# Patient Record
Sex: Female | Born: 1975 | Race: Black or African American | Hispanic: No | Marital: Single | Smoking: Current every day smoker
Health system: Southern US, Community
[De-identification: ages and names within clinical notes are randomized; demographics above are authoritative.]

## PROBLEM LIST (undated history)

## (undated) DIAGNOSIS — R55 Syncope and collapse: Secondary | ICD-10-CM

## (undated) DIAGNOSIS — D649 Anemia, unspecified: Secondary | ICD-10-CM

## (undated) DIAGNOSIS — R011 Cardiac murmur, unspecified: Secondary | ICD-10-CM

## (undated) DIAGNOSIS — R51 Headache: Secondary | ICD-10-CM

## (undated) DIAGNOSIS — I10 Essential (primary) hypertension: Secondary | ICD-10-CM

## (undated) DIAGNOSIS — I639 Cerebral infarction, unspecified: Secondary | ICD-10-CM

## (undated) DIAGNOSIS — I82409 Acute embolism and thrombosis of unspecified deep veins of unspecified lower extremity: Secondary | ICD-10-CM

## (undated) DIAGNOSIS — N920 Excessive and frequent menstruation with regular cycle: Secondary | ICD-10-CM

## (undated) DIAGNOSIS — I2699 Other pulmonary embolism without acute cor pulmonale: Secondary | ICD-10-CM

## (undated) DIAGNOSIS — R519 Headache, unspecified: Secondary | ICD-10-CM

## (undated) DIAGNOSIS — E669 Obesity, unspecified: Secondary | ICD-10-CM

## (undated) HISTORY — DX: Obesity, unspecified: E66.9

## (undated) HISTORY — DX: Essential (primary) hypertension: I10

## (undated) HISTORY — DX: Syncope and collapse: R55

## (undated) HISTORY — DX: Anemia, unspecified: D64.9

## (undated) HISTORY — DX: Excessive and frequent menstruation with regular cycle: N92.0

---
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
## 1998-04-10 ENCOUNTER — Inpatient Hospital Stay (HOSPITAL_COMMUNITY): Admission: AD | Admit: 1998-04-10 | Discharge: 1998-04-10 | Payer: Self-pay | Admitting: Obstetrics

## 1998-05-12 ENCOUNTER — Other Ambulatory Visit: Admission: RE | Admit: 1998-05-12 | Discharge: 1998-05-12 | Payer: Self-pay | Admitting: Obstetrics

## 1998-05-12 ENCOUNTER — Ambulatory Visit (HOSPITAL_COMMUNITY): Admission: RE | Admit: 1998-05-12 | Discharge: 1998-05-12 | Payer: Self-pay | Admitting: Obstetrics

## 1998-05-27 ENCOUNTER — Inpatient Hospital Stay (HOSPITAL_COMMUNITY): Admission: AD | Admit: 1998-05-27 | Discharge: 1998-05-27 | Payer: Self-pay | Admitting: Obstetrics

## 1998-06-03 ENCOUNTER — Ambulatory Visit (HOSPITAL_COMMUNITY): Admission: RE | Admit: 1998-06-03 | Discharge: 1998-06-03 | Payer: Self-pay | Admitting: Obstetrics

## 1998-07-30 ENCOUNTER — Ambulatory Visit (HOSPITAL_COMMUNITY): Admission: RE | Admit: 1998-07-30 | Discharge: 1998-07-30 | Payer: Self-pay | Admitting: Obstetrics

## 1998-11-06 ENCOUNTER — Inpatient Hospital Stay (HOSPITAL_COMMUNITY): Admission: AD | Admit: 1998-11-06 | Discharge: 1998-11-06 | Payer: Self-pay | Admitting: Obstetrics

## 1998-11-22 ENCOUNTER — Inpatient Hospital Stay (HOSPITAL_COMMUNITY): Admission: AD | Admit: 1998-11-22 | Discharge: 1998-11-25 | Payer: Self-pay | Admitting: Obstetrics

## 1998-11-27 ENCOUNTER — Encounter (HOSPITAL_COMMUNITY): Admission: RE | Admit: 1998-11-27 | Discharge: 1999-02-25 | Payer: Self-pay | Admitting: Obstetrics

## 1999-01-26 ENCOUNTER — Encounter (HOSPITAL_COMMUNITY): Admission: RE | Admit: 1999-01-26 | Discharge: 1999-04-26 | Payer: Self-pay | Admitting: *Deleted

## 1999-05-02 ENCOUNTER — Encounter (HOSPITAL_COMMUNITY): Admission: RE | Admit: 1999-05-02 | Discharge: 1999-07-31 | Payer: Self-pay | Admitting: Obstetrics

## 1999-09-29 ENCOUNTER — Encounter (HOSPITAL_COMMUNITY): Admission: RE | Admit: 1999-09-29 | Discharge: 1999-12-28 | Payer: Self-pay | Admitting: Obstetrics

## 1999-11-08 ENCOUNTER — Other Ambulatory Visit: Admission: RE | Admit: 1999-11-08 | Discharge: 1999-11-08 | Payer: Self-pay | Admitting: Obstetrics

## 1999-12-30 ENCOUNTER — Encounter: Admission: RE | Admit: 1999-12-30 | Discharge: 2000-03-29 | Payer: Self-pay | Admitting: Obstetrics

## 2000-02-06 ENCOUNTER — Other Ambulatory Visit: Admission: RE | Admit: 2000-02-06 | Discharge: 2000-02-06 | Payer: Self-pay | Admitting: Obstetrics

## 2000-04-09 ENCOUNTER — Inpatient Hospital Stay (HOSPITAL_COMMUNITY): Admission: AD | Admit: 2000-04-09 | Discharge: 2000-04-09 | Payer: Self-pay | Admitting: Obstetrics

## 2000-04-23 ENCOUNTER — Inpatient Hospital Stay (HOSPITAL_COMMUNITY): Admission: AD | Admit: 2000-04-23 | Discharge: 2000-04-23 | Payer: Self-pay | Admitting: Obstetrics

## 2000-04-28 ENCOUNTER — Encounter: Admission: RE | Admit: 2000-04-28 | Discharge: 2000-07-27 | Payer: Self-pay | Admitting: Obstetrics

## 2000-04-28 ENCOUNTER — Inpatient Hospital Stay (HOSPITAL_COMMUNITY): Admission: AD | Admit: 2000-04-28 | Discharge: 2000-04-28 | Payer: Self-pay | Admitting: Obstetrics

## 2000-05-02 ENCOUNTER — Inpatient Hospital Stay (HOSPITAL_COMMUNITY): Admission: AD | Admit: 2000-05-02 | Discharge: 2000-05-02 | Payer: Self-pay | Admitting: Obstetrics

## 2000-05-02 ENCOUNTER — Inpatient Hospital Stay (HOSPITAL_COMMUNITY): Admission: AD | Admit: 2000-05-02 | Discharge: 2000-05-05 | Payer: Self-pay | Admitting: Obstetrics

## 2000-07-29 ENCOUNTER — Encounter: Admission: RE | Admit: 2000-07-29 | Discharge: 2000-08-09 | Payer: Self-pay | Admitting: Obstetrics

## 2001-07-20 ENCOUNTER — Emergency Department (HOSPITAL_COMMUNITY): Admission: EM | Admit: 2001-07-20 | Discharge: 2001-07-20 | Payer: Self-pay | Admitting: Emergency Medicine

## 2001-07-25 ENCOUNTER — Emergency Department (HOSPITAL_COMMUNITY): Admission: EM | Admit: 2001-07-25 | Discharge: 2001-07-25 | Payer: Self-pay | Admitting: Emergency Medicine

## 2003-11-30 ENCOUNTER — Inpatient Hospital Stay (HOSPITAL_COMMUNITY): Admission: AD | Admit: 2003-11-30 | Discharge: 2003-12-02 | Payer: Self-pay | Admitting: Obstetrics

## 2004-03-02 ENCOUNTER — Ambulatory Visit (HOSPITAL_COMMUNITY): Admission: RE | Admit: 2004-03-02 | Discharge: 2004-03-02 | Payer: Self-pay | Admitting: Obstetrics

## 2005-09-15 ENCOUNTER — Emergency Department (HOSPITAL_COMMUNITY): Admission: EM | Admit: 2005-09-15 | Discharge: 2005-09-15 | Payer: Self-pay | Admitting: Emergency Medicine

## 2005-09-17 ENCOUNTER — Emergency Department (HOSPITAL_COMMUNITY): Admission: EM | Admit: 2005-09-17 | Discharge: 2005-09-17 | Payer: Self-pay | Admitting: Emergency Medicine

## 2005-11-20 HISTORY — PX: TUBAL LIGATION: SHX77

## 2005-12-21 ENCOUNTER — Encounter (INDEPENDENT_AMBULATORY_CARE_PROVIDER_SITE_OTHER): Payer: Self-pay | Admitting: *Deleted

## 2005-12-21 LAB — CONVERTED CEMR LAB

## 2005-12-23 ENCOUNTER — Inpatient Hospital Stay (HOSPITAL_COMMUNITY): Admission: AD | Admit: 2005-12-23 | Discharge: 2005-12-23 | Payer: Self-pay | Admitting: Obstetrics & Gynecology

## 2006-01-03 ENCOUNTER — Ambulatory Visit: Payer: Self-pay | Admitting: Family Medicine

## 2006-01-08 ENCOUNTER — Ambulatory Visit: Payer: Self-pay | Admitting: Family Medicine

## 2006-02-27 ENCOUNTER — Ambulatory Visit: Payer: Self-pay | Admitting: Family Medicine

## 2006-03-01 ENCOUNTER — Ambulatory Visit (HOSPITAL_COMMUNITY): Admission: RE | Admit: 2006-03-01 | Discharge: 2006-03-01 | Payer: Self-pay | Admitting: Family Medicine

## 2006-03-06 ENCOUNTER — Ambulatory Visit: Payer: Self-pay | Admitting: Family Medicine

## 2006-05-25 ENCOUNTER — Ambulatory Visit: Payer: Self-pay | Admitting: Family Medicine

## 2006-06-22 ENCOUNTER — Ambulatory Visit: Payer: Self-pay | Admitting: Sports Medicine

## 2006-06-27 ENCOUNTER — Ambulatory Visit (HOSPITAL_COMMUNITY): Admission: RE | Admit: 2006-06-27 | Discharge: 2006-06-27 | Payer: Self-pay | Admitting: Internal Medicine

## 2006-07-11 ENCOUNTER — Ambulatory Visit: Payer: Self-pay | Admitting: Sports Medicine

## 2006-07-26 ENCOUNTER — Inpatient Hospital Stay (HOSPITAL_COMMUNITY): Admission: AD | Admit: 2006-07-26 | Discharge: 2006-07-29 | Payer: Self-pay | Admitting: Obstetrics and Gynecology

## 2006-07-26 ENCOUNTER — Ambulatory Visit: Payer: Self-pay | Admitting: Gynecology

## 2006-07-26 ENCOUNTER — Ambulatory Visit: Payer: Self-pay | Admitting: Family Medicine

## 2006-08-06 ENCOUNTER — Ambulatory Visit: Payer: Self-pay | Admitting: Family Medicine

## 2006-09-11 ENCOUNTER — Ambulatory Visit: Payer: Self-pay | Admitting: Family Medicine

## 2006-09-20 ENCOUNTER — Ambulatory Visit: Payer: Self-pay | Admitting: Family Medicine

## 2007-01-17 DIAGNOSIS — I10 Essential (primary) hypertension: Secondary | ICD-10-CM

## 2007-01-17 DIAGNOSIS — D509 Iron deficiency anemia, unspecified: Secondary | ICD-10-CM | POA: Insufficient documentation

## 2007-01-18 ENCOUNTER — Encounter (INDEPENDENT_AMBULATORY_CARE_PROVIDER_SITE_OTHER): Payer: Self-pay | Admitting: *Deleted

## 2009-06-25 ENCOUNTER — Ambulatory Visit: Payer: Self-pay | Admitting: Family Medicine

## 2009-06-25 LAB — CONVERTED CEMR LAB: Beta hcg, urine, semiquantitative: NEGATIVE

## 2009-08-28 ENCOUNTER — Encounter (INDEPENDENT_AMBULATORY_CARE_PROVIDER_SITE_OTHER): Payer: Self-pay | Admitting: *Deleted

## 2009-08-28 DIAGNOSIS — F17200 Nicotine dependence, unspecified, uncomplicated: Secondary | ICD-10-CM

## 2010-08-26 ENCOUNTER — Encounter: Payer: Self-pay | Admitting: Family Medicine

## 2010-12-20 NOTE — Letter (Signed)
Summary: Generic Letter  [facility]     [place], [place]    Phone:   Fax:     08/26/2010  [facility] [address] [place], [place]  [address]  Dear Ms. Raia,    I am writing to inform you that you need to make an appointment to come in for your yearly well-woman visit, which includes a PAP smear. Please call our office to schedule an appointment with me, Dr. Fara Boros, your new primary care doctor.  I look forward to meeting you!       Sincerely,   Demetria Pore MD  Appended Document: Generic Letter mailed.  Appended Document: Generic Letter Letter returned unable to forward.

## 2011-04-07 NOTE — Op Note (Signed)
Melissa Krueger, Melissa Krueger                     ACCOUNT NO.:  [id number]   MEDICAL RECORD NO.:  [id number]                   PATIENT TYPE:  AMB   LOCATION:  SDC                                  [facility]ILITY:  [facility]   PHYSICIAN:  Kathreen Cosier, M.D.           DATE OF BIRTH:  1976/06/29   DATE OF PROCEDURE:  03/02/2004  DATE OF DISCHARGE:                                 OPERATIVE REPORT   PREOPERATIVE DIAGNOSIS:  Multiparity.   POSTOPERATIVE DIAGNOSIS:  Multiparity.   PROCEDURE:  Open laparoscopic tubal sterilization.   Under general anesthesia, the patient in lithotomy position, the abdomen,  perineum, and vagina were prepped and draped.  Bladder emptied with straight  catheter, with the speculum placed in the vagina and the Hulka tenaculum  placed in the cervix.  In the umbilicus, a transversae incision was made and  carried down to the fascia.  The fascia was grasped with two Kochers,  cleaned, and the fascia and the peritoneum opened with the Mayo scissors  The sleeve of the trocar was inserted intraperitoneally.  3 liters of carbon  dioxide infused intraperitoneally.  Visualizing the scope was inserted.  The  uterus, tubes, and ovaries were normal.  The cautery probe inserted through  the sleeve of the scope.  The right tube was grasped 1 inch from the cornua,  cauterized.  The tube was cauterized in a total of four places moving  lateral from the first site of cautery.  Approximately 2 inches of tube  cauterized.  The probe was removed, CO2 allowed to escape from the  peritoneal cavity, and the fascia was closed with one stitch of 0 Dexon.  The skin was closed with subcuticular stitch of 4-0 Monocryl.  The patient  tolerated the procedure well and was taken to the recovery room in good  condition.                                               Kathreen Cosier, M.D.    BAM/MEDQ  D:  03/02/2004  T:  03/02/2004  Job:  [id number]

## 2011-04-07 NOTE — Op Note (Signed)
NAMELIBRADA, CASTRONOVO         ACCOUNT NO.:  [id number]   MEDICAL RECORD NO.:  [id number]          PATIENT TYPE:  INP   LOCATION:  9373                          [facility]ACILITY:  [facility]   PHYSICIAN:  Lesly Dukes, M.D. DATE OF BIRTH:  1976/11/19   DATE OF PROCEDURE:  07/28/2006  DATE OF DISCHARGE:                                 OPERATIVE REPORT   PREOPERATIVE DIAGNOSIS:  The patient is a 35 year old para 5 female who  desires sterilization.   POSTOPERATIVE DIAGNOSIS:  The patient is a 35 year old para 56 female who  desires sterilization.   PROCEDURE:  Repeat postpartum bilateral tubal ligation.   SURGEON:  Lesly Dukes, M.D.   ANESTHESIA:  Spinal.   ESTIMATED BLOOD LOSS:  Minimal.   SPECIMENS:  None.   COMPLICATIONS:  None.   DESCRIPTION OF PROCEDURE:  After informed consent was obtained, the patient  was taken to the operating room and spinal anesthesia was induced.  The  patient was placed in the dorsal lithotomy position and the patient was  prepped and draped in the normal sterile fashion.  The bladder was emptied.  An umbilical skin incision was made with the scalpel and carried down to the  fascia.  The fascia was incised vertically and the peritoneum was entered  bluntly and traction was placed on the abdomen; and a survey of abdominal  contents was performed.   It was noted that the patient had had a tubal ligation before and this was  done via cautery, via laparoscopy.  The fallopian tubes were not connected  to the cornua any more.  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  There were only small pieces of tube hanging with lots of adhesions.  I am  unsure exactly how the patient got pregnant.  I did place 2 __________ clips  across the left fallopian tube and 1 __________ across the right fallopian  tube.  However, given each fallopian tube was transected, I am not sure how  the patient is getting pregnant.  I am going to recommend that the patient  use an IUD for contraception.  I cannot guarantee that my procedure is going  to work either.  There was good hemostasis at the end of the procedure.  All  instruments and laps were removed form the patient's abdomen and the counts  were correct.  The fascia was closed with #0 Vicryl and the skin was closed  with 4-0 Vicryl in a subcuticular fashion.   The patient tolerated the procedure well.  All counts were correct and the  patient went to the recovery room in stable condition.  Her blood pressure  160/100 postpartum.  She is chronically hypertensive; however, she was  deemed __________ and I will continue this postoperatively.           ______________________________  Lesly Dukes, M.D.     KHL/MEDQ  D:  07/28/2006  T:  07/28/2006  Job:  [id number]

## 2011-04-07 NOTE — Discharge Summary (Signed)
NAMETEMPRENCE, RHINES         ACCOUNT NO.:  [id number]   MEDICAL RECORD NO.:  [id number]          PATIENT TYPE:  INP   LOCATION:  9373                          [facility]ILITY:  [facility]   PHYSICIAN:  Lesly Dukes, M.D. DATE OF BIRTH:  1975-11-21   DATE OF ADMISSION:  07/26/2006  DATE OF DISCHARGE:  07/29/2006                                 DISCHARGE SUMMARY   HISTORY OF PRESENT ILLNESS:  The patient is a 35 year old G5, P5-0-0-5, who  was admitted on July 26, 2006 for induction of labor at 39 weeks for  chronic hypertension. The patient was a patient of Dr. __________ at the  [facility]. She was induced with Pitocin, which was  unsuccessful. She then had Cytotec placed and then Pitocin restarted, which  was then successful. She delivered a viable female infant with Apgar's of 9 at  1 minute and 9 at 5 minutes who weighed 7 pounds, 11 ounces, and was 20 3/4  inches long. The baby was delivered vertex presentation. The placenta  delivered spontaneously and had a 3 vessel cord. There were no lacerations  or episiotomy. The EBL was approximately 500 cc. The amniotic fluid was  clear throughout the procedure. The patient received penicillin for GBS  unknown, based on risk factors. The patient underwent tubal ligation  postpartum day 1. She had a previous tubal ligation by Dr. Gaynell Face. At the  time of the tubal, it was noted that both tubes were completely transected.  __________ clips were applied again. Details of this operation are in the  operative note. It is suggested that the patient use another form of backup  birth control. It is unsure whether this tubal will work as well. She was  __________ postpartum because she had elevated blood pressures postpartum.  Blood pressures on discharge are 130's to 140's over 80's. She is a known  chronic hypertensive but has not been on medications.   DISCHARGE MEDICATIONS:  Percocet, iron, and Colace.   LABORATORY DATA:  On  discharge, hemoglobin 7.9.   ACTIVITY:  Nothing per vagina for 6 weeks. No vigorous exercise for 6 weeks.   FOLLOWUP:  1. Dr. __________ for 1 week, blood pressure check.  2. Postpartum examination in 6 weeks for IUD insertion.           ______________________________  Lesly Dukes, M.D.     KHL/MEDQ  D:  07/29/2006  T:  07/29/2006  Job:  [id number]

## 2011-10-05 ENCOUNTER — Ambulatory Visit (INDEPENDENT_AMBULATORY_CARE_PROVIDER_SITE_OTHER): Payer: Medicaid Other | Admitting: Family Medicine

## 2011-10-05 ENCOUNTER — Encounter: Payer: Self-pay | Admitting: Family Medicine

## 2011-10-05 VITALS — BP 154/102 | HR 80 | Temp 98.2°F | Ht 67.5 in | Wt 232.0 lb

## 2011-10-05 DIAGNOSIS — Z0000 Encounter for general adult medical examination without abnormal findings: Secondary | ICD-10-CM

## 2011-10-05 DIAGNOSIS — F17200 Nicotine dependence, unspecified, uncomplicated: Secondary | ICD-10-CM

## 2011-10-05 DIAGNOSIS — E669 Obesity, unspecified: Secondary | ICD-10-CM

## 2011-10-05 DIAGNOSIS — Z01419 Encounter for gynecological examination (general) (routine) without abnormal findings: Secondary | ICD-10-CM | POA: Insufficient documentation

## 2011-10-05 DIAGNOSIS — I10 Essential (primary) hypertension: Secondary | ICD-10-CM

## 2011-10-05 DIAGNOSIS — Z23 Encounter for immunization: Secondary | ICD-10-CM

## 2011-10-05 LAB — CBC
HCT: 34.4 % — ABNORMAL LOW (ref 36.0–46.0)
Hemoglobin: 11.2 g/dL — ABNORMAL LOW (ref 12.0–15.0)
MCH: 22.9 pg — ABNORMAL LOW (ref 26.0–34.0)
MCHC: 32.6 g/dL (ref 30.0–36.0)
Platelets: 364 10*3/uL (ref 150–400)
RBC: 4.9 MIL/uL (ref 3.87–5.11)
RDW: 18.1 % — ABNORMAL HIGH (ref 11.5–15.5)
WBC: 9.5 10*3/uL (ref 4.0–10.5)

## 2011-10-05 LAB — LIPID PANEL
Cholesterol: 190 mg/dL (ref 0–200)
HDL: 36 mg/dL — ABNORMAL LOW (ref >39)
Total CHOL/HDL Ratio: 5.3 Ratio
Triglycerides: 97 mg/dL (ref <150)
VLDL: 19 mg/dL (ref 0–40)

## 2011-10-05 LAB — BASIC METABOLIC PANEL
BUN: 6 mg/dL (ref 6–23)
Glucose, Bld: 82 mg/dL (ref 70–99)
Potassium: 4 mEq/L (ref 3.5–5.3)
Sodium: 139 mEq/L (ref 135–145)

## 2011-10-05 MED ORDER — LISINOPRIL-HYDROCHLOROTHIAZIDE 10-12.5 MG PO TABS: 1.0000 | ORAL_TABLET | Freq: Every day | ORAL | Status: DC

## 2011-10-05 MED ORDER — NICOTINE 7 MG/24HR TD PT24: 1.0000 | MEDICATED_PATCH | TRANSDERMAL | Status: AC

## 2011-10-05 MED ORDER — NICOTINE POLACRILEX 2 MG MT GUM: 2.0000 mg | CHEWING_GUM | OROMUCOSAL | Status: AC | PRN

## 2011-10-05 NOTE — Assessment & Plan Note (Signed)
Blood pressure substantially elevated today. Will check bmet and start patient on lisinopril/HCTZ. Counseled patient on a low sodium diet.  BP Readings from Last 3 Encounters:  10/05/11 154/102  06/25/09 134/86

## 2011-10-05 NOTE — Assessment & Plan Note (Signed)
Patient reports being interested in quitting. Sent in prescriptions for nicotine patch and nicotine gum. Patient also given information on the quit line. Consider sending to pharm clinic if further support needed.

## 2011-10-05 NOTE — Progress Notes (Signed)
S: Pt comes in today for general exam.  Patient went to the dentist have some dental work done and was told that she needed to see her regular doctor because her blood pressure was too high. So the patient made an appointment to be seen. Patient would prefer to wait to have a Pap smear done as she started her menstrual cycle yesterday.  HYPERTENSION BP: 164/121 --> 154/102 Meds: NONE Symptoms: Headache: No Dizziness: No Vision changes: No SOB:  No Chest pain: No LE swelling: Yes : At the end of the day Tobacco use: Yes Diet: Eats one meal per day which is dinner, sometimes tries to make a salad but does admit that the salad has large amounts of meat, cheese, and egg in it. She sometimes fries and sometimes bakes food. She uses a lot of hamburger. She knows that she does not eat enough fruits and veggies. She does endorse drinking 5 cans of regular Mountain Dew soda per. According to pt, she had high blood pressure during her last pregnancy and was started on medication. This medication was stopped not long after she delivered. She has not been on any antihypertensive medication since then.  SYNCOPE One episode a few weeks ago. Patient states she was standing at home and that ended in a hectic day. She thinks that it may been related to her blood pressure getting high. She did not have any presyncopal symptoms prior to the episode of passing out. She denied any chest pain, shortness of breath, dizziness, sweating, or nausea. She states that she was "a little woozy" for about 5 minutes afterwards. Did not have any urine or bowel incontinence. Her children did not report any shaking or seizure like activity during the syncopal episode. This has never happened before and she has not had an episode since the one episode a few weeks ago.  OBESITY Eats one meal per day which is dinner, sometimes tries to make a salad but does admit that the salad has large amounts of meat, cheese, and egg in it. She sometimes  fries and sometimes bakes food. She uses a lot of hamburger. She knows that she does not eat enough fruits and veggies. She does endorse drinking 5 cans of regular Mountain Dew soda per. Patient does walk her children to school every day. She reports that this is approximately a 10-15 minute walk. There is along the route that she could walk.  TOBACCO USE Patient used to smoke a half pack per day approximately 3 months ago. Since then, she has decreased to 5 cigarettes per day. She is very interested in quitting smoking. She has not tried anything to help her quit. She is just trying to cut down. She is interested in assistance.   ROS: Per HPI  History  Smoking status  . Current Everyday Smoker -- 0.2 packs/day  . Types: Cigarettes  Smokeless tobacco  . Never Used    O:  Filed Vitals:   10/05/11 1001  BP: 154/102  Pulse: 80  Temp: 98.2 F (36.8 C)    Gen: NAD HEENT: poor dentition, pharynx nonerythematous, very poor dentition without obvious abscess CV: RRR, no murmur Pulm: CTA bilat, no wheezes or crackles Abd: soft, NT, obese, + BS Ext: Warm, no chronic skin changes, no edema   A/P: 35 y.o. female p/w HTN -See problem list -f/u in 3-4 weeks

## 2011-10-05 NOTE — Assessment & Plan Note (Signed)
Patient initially scheduled as a physical but would like to wait for her Pap smear since she is on her menstrual cycle. Since patient is to followup in one month for blood pressure I told her we could do it at that time. Patient denies flu or tetanus shot today. Patient is fasting so we'll check baseline labs including BMET, lipid panel, CBC.

## 2011-10-05 NOTE — Assessment & Plan Note (Signed)
Body mass index is 35.80 kg/(m^2). Patient is obese. Counseled patient on eating 3 meals per day. Patient agrees to start with decreasing her soda intake from 5 cans to 3 cans per day. She will also take the "long route "when she is walking her children to school so that she gets 30 minutes of walking 5 times per week. Informed patient I will followup at next visit. That this will be a slow process but that weight loss is very important for her health.

## 2011-10-05 NOTE — Patient Instructions (Addendum)
It was great to meet you today! Your blood pressure was very high!  We really need to work on getting this down! I'm going to start a medicine to help with this.  However, the MOST important thing is going to be lifestyle changes!!!!  This means eating better, exercising more, and trying to lose weight.  Watching the SALT in your diet is also going to be very import!  Today, you agreed to: cutting back on soda.  You will drink no more than 3 per day (down from 5) You will also take the "long" way walking the kids to and from the bus stop.  This means that you will be walking 30 minutes 5 days per week!  It's great that you are already thinking about quitting smoking!  This is very important for your health.  There are many things we can do to help you quit.  You can also call 1-800-QUIT-NOW (539) 570-3915) for free smoking cessation counseling.  I am also sending in a prescription for a nicotine patch.  This will help take the edge off of the nicotine.  You can also use the gum to help with any cravings.   We checked some blood work today.  You can expect a letter from me in the next few weeks with the results.  Come back and see me in 3-4 weeks so we can recheck your blood pressure and see how quitting smoking is going!     1.5 Gram Low Sodium Diet A 1.5 gram sodium diet restricts the amount of salt in your diet. You can have no more than 1.5 grams (1500 miligrams) in 1 day. This can help lessen your risk for developing high blood pressure. This diet may also reduce your chance of having a heart attack or stroke. It is important that you know what to look for when choosing foods and drinks.  HOME CARE   Do not add salt to food.   Avoid convenience items and fast food.   Choose unsalted snack foods.   Buy products labeled "low sodium" or "no salt added" when possible.   Check food labels to learn how much sodium is in 1 serving.   When eating at a restaurant, ask that your food be  prepared with less salt or none, if possible.  The nutrition facts label is a good place to find how much sodium is in foods. Look for products with no more than 400 mg of sodium per serving. Remember that 1.5 g = 1500 mg. CHOOSING FOODS Grains  Avoid: Salted crackers and snack items. Some cereals, including instant hot cereals. Bread stuffing and biscuit mixes. Seasoned rice or pasta mixes.   Choose: Unsalted snack items. Low-sodium cereals, oats, puffed wheat and rice, shredded wheat. English muffins and bread. Pasta.  Meats  Avoid:  Salted, canned, smoked, spiced, pickled meats, including fish and poultry. Bacon, ham, sausage, cold cuts, hot dogs, anchovies.   Choose: Low-sodium canned tuna and salmon. Fresh or frozen meat, poultry, and fish.  Dairy  Avoid: Processed cheese and spreads. Cottage cheese. Buttermilk and condensed milk. Regular cheese.   Choose:  Milk. Low-sodium cottage cheese. Yogurt. Sour cream. Low-sodium cheese.  Fruits and Vegetables  Avoid:  Regular canned vegetables. Regular canned tomato sauce and paste. Frozen vegetables in sauces. Olives. Rosita Fire. Relishes. Sauerkraut.   Choose:  Low-sodium canned vegetables. Low-sodium tomato sauce and paste. Frozen or fresh vegetables. Fresh and frozen fruit.  Condiments  Avoid:  Canned and packaged gravies. Worcestershire  sauce. Tartar sauce. Barbecue sauce. Soy sauce. Steak sauce. Ketchup. Onion, garlic, and table salt. Meat flavorings and tenderizers.   Choose:  Fresh and dried herbs and spices. Low-sodium varieties of mustard and ketchup. Lemon juice. Tabasco sauce. Horseradish.  Document Released: 12/09/2010 Document Revised: 07/19/2011 Document Reviewed: 12/09/2010 [facility] Patient Information 2012 [place], [place].

## 2011-11-01 ENCOUNTER — Ambulatory Visit: Payer: Medicaid Other | Admitting: Family Medicine

## 2011-11-27 ENCOUNTER — Ambulatory Visit: Payer: Medicaid Other | Admitting: Family Medicine

## 2012-01-24 ENCOUNTER — Encounter (HOSPITAL_COMMUNITY): Payer: Self-pay | Admitting: *Deleted

## 2012-01-24 ENCOUNTER — Emergency Department (HOSPITAL_COMMUNITY)
Admission: EM | Admit: 2012-01-24 | Discharge: 2012-01-24 | Disposition: A | Discharge status: Home / Self Care | Payer: Self-pay | Attending: Emergency Medicine | Admitting: Emergency Medicine

## 2012-01-24 DIAGNOSIS — F17200 Nicotine dependence, unspecified, uncomplicated: Secondary | ICD-10-CM | POA: Insufficient documentation

## 2012-01-24 DIAGNOSIS — K047 Periapical abscess without sinus: Secondary | ICD-10-CM | POA: Insufficient documentation

## 2012-01-24 DIAGNOSIS — I10 Essential (primary) hypertension: Secondary | ICD-10-CM | POA: Insufficient documentation

## 2012-01-24 MED ORDER — PENICILLIN V POTASSIUM 500 MG PO TABS: 500.0000 mg | ORAL_TABLET | Freq: Four times a day (QID) | ORAL | Status: AC

## 2012-01-24 MED ORDER — PENICILLIN V POTASSIUM 250 MG PO TABS
500.0000 mg | ORAL_TABLET | Freq: Once | ORAL | Status: AC
  Administered 2012-01-24: 500 mg via ORAL
  Filled 2012-01-24: qty 2

## 2012-01-24 MED ORDER — OXYCODONE-ACETAMINOPHEN 5-325 MG PO TABS: 1.0000 | ORAL_TABLET | ORAL | Status: AC | PRN

## 2012-01-24 MED ORDER — OXYCODONE-ACETAMINOPHEN 5-325 MG PO TABS
2.0000 | ORAL_TABLET | Freq: Once | ORAL | Status: AC
  Administered 2012-01-24: 2 via ORAL
  Filled 2012-01-24: qty 2

## 2012-01-24 NOTE — Discharge Instructions (Signed)
Abscessed Tooth A tooth abscess is a collection of infected fluid (pus) from a bacterial infection in the inner part of the tooth (pulp). It usually occurs at the end of the tooth's root.  CAUSES   A very bad cavity (extensive tooth decay).   Trauma to the tooth, such as a broken or chipped tooth, that allows bacteria to enter into the pulp.  SYMPTOMS  Severe pain in and around the infected tooth.   Swelling and redness around the abscessed tooth or in the mouth or face.   Tenderness.   Pus drainage.   Bad breath.   Bitter taste in the mouth.   Difficulty swallowing.   Difficulty opening the mouth.   Feeling sick to your stomach (nauseous).   Vomiting.   Chills.   Swollen neck glands.  DIAGNOSIS  A medical and dental history will be taken.   An examination will be performed by tapping on the abscessed tooth.   X-rays may be taken of the tooth to identify the abscess.  TREATMENT The goal of treatment is to eliminate the infection.   You may be prescribed antibiotic medicine to stop the infection from spreading.   A root canal may be performed to save the tooth. If the tooth cannot be saved, it may be pulled (extracted) and the abscess may be drained.  HOME CARE INSTRUCTIONS  Only take over-the-counter or prescription medicines for pain, fever, or discomfort as directed by your caregiver.   Do not drive after taking pain medicine (narcotics).   Rinse your mouth (gargle) often with salt water ( tsp salt in 8 oz of warm water) to relieve pain or swelling.   Do not apply heat to the outside of your face.   Return to your dentist for further treatment as directed.  SEEK IMMEDIATE DENTAL CARE IF:  You have a temperature by mouth above 102 F (38.9 C), not controlled by medicine.   You have chills or a very bad headache.   You have problems breathing or swallowing.   Your have trouble opening your mouth.   You develop swelling in the neck or around the eye.    Your pain is not helped by medicine.   Your pain is getting worse instead of better.  Document Released: 11/06/2005 Document Revised: 10/26/2011 Document Reviewed: 02/14/2011 [employer] Patient Information 2012 [employer]. 

## 2012-01-24 NOTE — ED Provider Notes (Signed)
History     CSN: [id number]  Arrival date & time 01/24/12  1478   First MD Initiated Contact with Patient 01/24/12 1105      Chief Complaint  Patient presents with  . Dental Pain     Patient is a 36 y.o. female presenting with tooth pain. The history is provided by the patient.  Dental PainThe primary symptoms include mouth pain and fever. Primary symptoms do not include shortness of breath, angioedema or cough. The symptoms began 3 to 5 days ago. The symptoms are worsening. The symptoms are new. The symptoms occur constantly.  Additional symptoms include: facial swelling. Additional symptoms do not include: drooling.  she has done nothing for the symptoms  She reports dental pain and left facial swelling No trauma to face Reports pain was "so bad I passed out" yesterday.  No head injury, no traumatic injury reported.  No cp/sob/dizziness She reports fever/chills No cp/sob No neck stiffness  Past Medical History  Diagnosis Date  . Hypertension   . Anemia     Past Surgical History  Procedure Date  . Tubal ligation   . Breast surgery     Family History  Problem Relation Age of Onset  . Hypertension Mother   . Hyperlipidemia Mother   . Diabetes Father   . Alcohol abuse Father   . Lupus Sister     History  Substance Use Topics  . Smoking status: Current Everyday Smoker -- 0.2 packs/day    Types: Cigarettes  . Smokeless tobacco: Never Used  . Alcohol Use: Yes    OB History    Grav Para Term Preterm Abortions TAB SAB Ect Mult Living                  Review of Systems  Constitutional: Positive for fever.  HENT: Positive for facial swelling. Negative for drooling.   Respiratory: Negative for cough and shortness of breath.     Allergies  Review of patient's allergies indicates no known allergies.  Home Medications   Current Outpatient Rx  Name Route Sig Dispense Refill  . ACETAMINOPHEN 500 MG PO TABS Oral Take 500 mg by mouth every 6 (six) hours as  needed. For pain    . OXYCODONE-ACETAMINOPHEN 5-325 MG PO TABS Oral Take 1 tablet by mouth every 4 (four) hours as needed for pain. 15 tablet 0  . PENICILLIN V POTASSIUM 500 MG PO TABS Oral Take 1 tablet (500 mg total) by mouth 4 (four) times daily. 40 tablet 0    BP 149/115  Pulse 100  Temp(Src) 98.8 F (37.1 C) (Oral)  Resp 16  SpO2 95%  LMP 01/17/2012  Physical Exam CONSTITUTIONAL: Well developed/well nourished HEAD AND FACE: Normocephalic/atraumatic EYES: EOMI/PERRL ENMT: Mucous membranes moist, no trismus.  Tenderness along gingival surface of left mandible but no visible intraoral abscess.  There is focal tenderness/swelling externally under mandible, but no surrounding erythema/abscess.  Voice normal.  No drooling.  Poor dentition.  No facial deformity/crepitance noted Spine- cspine nontender NECK: supple no meningeal signs CV: S1/S2 noted, no murmurs/rubs/gallops noted LUNGS: Lungs are clear to auscultation bilaterally, no apparent distress ABDOMEN: soft, nontender, no rebound or guarding GU:no cva tenderness NEURO: Pt is awake/alert, moves all extremitiesx4 EXTREMITIES: pulses normal, full ROM, no tenderness noted, no deformity noted SKIN: warm, color normal PSYCH: no abnormalities of mood noted  ED Course  Procedures    1. Dental abscess    Pt will need close f/u with dental, advised calling them today,  start PCN, discussed strict return precautions.  The patient appears reasonably screened and/or stabilized for discharge and I doubt any other medical condition or other [facility] requiring further screening, evaluation, or treatment in the ED at this time prior to discharge.    MDM  Nursing notes reviewed and considered in documentation         Joya Gaskins, MD 01/24/12 1230

## 2012-01-24 NOTE — ED Notes (Signed)
Pt is here with swelling to her left lower jaw which began Sunday.  Pt has severe dental pain with this.  Pt has fractured molar and generally poor dentition.  Pt has had fever and chills with this as well as a syncopal episode yesterday while getting her kids from school.  No sob with this.  Swelling along left jaw line and under chin.

## 2012-12-21 ENCOUNTER — Emergency Department (HOSPITAL_COMMUNITY): Payer: Medicaid Other

## 2012-12-21 ENCOUNTER — Encounter (HOSPITAL_COMMUNITY): Payer: Self-pay | Admitting: Emergency Medicine

## 2012-12-21 ENCOUNTER — Emergency Department (HOSPITAL_COMMUNITY)
Admission: EM | Admit: 2012-12-21 | Discharge: 2012-12-21 | Disposition: A | Discharge status: Home / Self Care | Payer: Medicaid Other | Attending: Emergency Medicine | Admitting: Emergency Medicine

## 2012-12-21 DIAGNOSIS — Z79899 Other long term (current) drug therapy: Secondary | ICD-10-CM | POA: Insufficient documentation

## 2012-12-21 DIAGNOSIS — I10 Essential (primary) hypertension: Secondary | ICD-10-CM | POA: Insufficient documentation

## 2012-12-21 DIAGNOSIS — M545 Low back pain, unspecified: Secondary | ICD-10-CM | POA: Insufficient documentation

## 2012-12-21 DIAGNOSIS — Z862 Personal history of diseases of the blood and blood-forming organs and certain disorders involving the immune mechanism: Secondary | ICD-10-CM | POA: Insufficient documentation

## 2012-12-21 DIAGNOSIS — S20229A Contusion of unspecified back wall of thorax, initial encounter: Secondary | ICD-10-CM | POA: Insufficient documentation

## 2012-12-21 DIAGNOSIS — F17200 Nicotine dependence, unspecified, uncomplicated: Secondary | ICD-10-CM | POA: Insufficient documentation

## 2012-12-21 MED ORDER — DIAZEPAM 5 MG PO TABS
5.0000 mg | ORAL_TABLET | Freq: Once | ORAL | Status: AC
  Administered 2012-12-21: 5 mg via ORAL
  Filled 2012-12-21: qty 1

## 2012-12-21 MED ORDER — HYDROCODONE-ACETAMINOPHEN 5-325 MG PO TABS
2.0000 | ORAL_TABLET | Freq: Once | ORAL | Status: AC
  Administered 2012-12-21: 2 via ORAL
  Filled 2012-12-21: qty 2

## 2012-12-21 MED ORDER — HYDROCODONE-ACETAMINOPHEN 5-325 MG PO TABS: 1.0000 | ORAL_TABLET | Freq: Four times a day (QID) | ORAL | Status: DC | PRN

## 2012-12-21 MED ORDER — NAPROXEN 500 MG PO TABS: 500.0000 mg | ORAL_TABLET | Freq: Two times a day (BID) | ORAL | Status: DC | PRN

## 2012-12-21 MED ORDER — METHOCARBAMOL 750 MG PO TABS: 750.0000 mg | ORAL_TABLET | Freq: Four times a day (QID) | ORAL | Status: DC | PRN
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
## 2012-12-22 NOTE — ED Provider Notes (Signed)
Medical screening examination/treatment/procedure(s) were performed by non-physician practitioner and as supervising physician I was immediately available for consultation/collaboration.  Mujahid Jalomo M Raahi Korber, MD 12/22/12 1217 

## 2014-05-25 ENCOUNTER — Encounter: Payer: Self-pay | Admitting: Family Medicine

## 2014-05-25 ENCOUNTER — Ambulatory Visit (HOSPITAL_COMMUNITY)
Admission: RE | Admit: 2014-05-25 | Discharge: 2014-05-25 | Disposition: A | Discharge status: Home / Self Care | Payer: Medicaid Other | Source: Ambulatory Visit | Attending: Family Medicine | Admitting: Family Medicine

## 2014-05-25 ENCOUNTER — Ambulatory Visit (INDEPENDENT_AMBULATORY_CARE_PROVIDER_SITE_OTHER): Payer: Medicaid Other | Admitting: Family Medicine

## 2014-05-25 VITALS — BP 164/103 | HR 115 | Temp 98.3°F | Wt 214.5 lb

## 2014-05-25 DIAGNOSIS — R55 Syncope and collapse: Secondary | ICD-10-CM | POA: Insufficient documentation

## 2014-05-25 DIAGNOSIS — D509 Iron deficiency anemia, unspecified: Secondary | ICD-10-CM

## 2014-05-25 DIAGNOSIS — I10 Essential (primary) hypertension: Secondary | ICD-10-CM | POA: Diagnosis not present

## 2014-05-25 MED ORDER — LISINOPRIL-HYDROCHLOROTHIAZIDE 20-25 MG PO TABS: 1.0000 | ORAL_TABLET | Freq: Every day | ORAL | Status: DC

## 2014-05-25 NOTE — Assessment & Plan Note (Addendum)
Syncope x3 episodes in the past, most recent about one month ago No clear etiology from her history, her uncontrolled hypertension is concerning EKG today with prolonged QTC and no other obvious abnormalities. Will refer to cardiology for further workup including possible Holter monitor and stress test, which I discussed with the patient
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
## 2014-05-25 NOTE — Patient Instructions (Signed)
Great to meet you!  I will call or send a letter about your labs  I have made a referaal to cardiology, they'll call for an appt  Come back in 2-4 weeks for a pap smear and HTN follow up  Hypertension Hypertension is another name for high blood pressure. High blood pressure forces your heart to work harder to pump blood. A blood pressure reading has two numbers, which includes a higher number over a lower number (example: 110/72). HOME CARE   Have your blood pressure rechecked by your doctor.  Only take medicine as told by your doctor. Follow the directions carefully. The medicine does not work as well if you skip doses. Skipping doses also puts you at risk for problems.  Do not smoke.  Monitor your blood pressure at home as told by your doctor. GET HELP IF:  You think you are having a reaction to the medicine you are taking.  You have repeat headaches or feel dizzy.  You have puffiness (swelling) in your ankles.  You have trouble with your vision. GET HELP RIGHT AWAY IF:   You get a very bad headache and are confused.  You feel weak, numb, or faint.  You get chest or belly (abdominal) pain.  You throw up (vomit).  You cannot breathe very well. MAKE SURE YOU:   Understand these instructions.  Will watch your condition.  Will get help right away if you are not doing well or get worse. Document Released: 04/24/2008 Document Revised: 11/11/2013 Document Reviewed: 08/29/2013 [facility] Patient Information 2015 [place], [place]. This information is not intended to replace advice given to you by your health care provider. Make sure you discuss any questions you have with your health care provider.

## 2014-05-25 NOTE — Assessment & Plan Note (Signed)
Uncontrolled and untreated per patient She has been started on lisinopril/HCTZ before Discussed dangers including MI and stroke CMP, CBC, TSH today Start lisinopril/HCTZ Followup 2-4 weeks

## 2014-05-25 NOTE — Assessment & Plan Note (Signed)
Likely iron deficiency anemia because of heavy menses Check CBC today Discuss further at next visit.

## 2014-05-26 LAB — CBC WITH DIFFERENTIAL/PLATELET
Basophils Absolute: 0 10*3/uL (ref 0.0–0.1)
Basophils Relative: 0 % (ref 0–1)
Eosinophils Absolute: 0.2 10*3/uL (ref 0.0–0.7)
Eosinophils Relative: 2 % (ref 0–5)
HCT: 33.2 % — ABNORMAL LOW (ref 36.0–46.0)
Hemoglobin: 10.3 g/dL — ABNORMAL LOW (ref 12.0–15.0)
Lymphocytes Relative: 36 % (ref 12–46)
Lymphs Abs: 3.3 10*3/uL (ref 0.7–4.0)
MCH: 21.3 pg — ABNORMAL LOW (ref 26.0–34.0)
MCHC: 31 g/dL (ref 30.0–36.0)
MCV: 68.7 fL — ABNORMAL LOW (ref 78.0–100.0)
Monocytes Absolute: 0.5 10*3/uL (ref 0.1–1.0)
Monocytes Relative: 6 % (ref 3–12)
Neutro Abs: 5.1 10*3/uL (ref 1.7–7.7)
Neutrophils Relative %: 56 % (ref 43–77)
Platelets: 314 10*3/uL (ref 150–400)
RBC: 4.83 MIL/uL (ref 3.87–5.11)
RDW: 20.3 % — ABNORMAL HIGH (ref 11.5–15.5)
WBC: 9.1 10*3/uL (ref 4.0–10.5)

## 2014-05-26 LAB — COMPREHENSIVE METABOLIC PANEL
ALT: <8 U/L (ref 0–35)
AST: 13 U/L (ref 0–37)
Albumin: 3.7 g/dL (ref 3.5–5.2)
Alkaline Phosphatase: 62 U/L (ref 39–117)
BUN: 5 mg/dL — ABNORMAL LOW (ref 6–23)
CO2: 24 mEq/L (ref 19–32)
Calcium: 8.1 mg/dL — ABNORMAL LOW (ref 8.4–10.5)
Chloride: 107 mEq/L (ref 96–112)
Creat: 0.65 mg/dL (ref 0.50–1.10)
Glucose, Bld: 75 mg/dL (ref 70–99)
Potassium: 3.7 mEq/L (ref 3.5–5.3)
Sodium: 138 mEq/L (ref 135–145)
Total Bilirubin: 0.3 mg/dL (ref 0.2–1.2)
Total Protein: 6.4 g/dL (ref 6.0–8.3)

## 2014-05-26 LAB — TSH: TSH: 0.58 u[IU]/mL (ref 0.350–4.500)

## 2014-05-28 ENCOUNTER — Telehealth: Payer: Self-pay | Admitting: Family Medicine

## 2014-05-28 NOTE — Telephone Encounter (Signed)
LVM to Pt   appt 08/19@10 :45 [facility]  [address]    [place]

## 2014-05-29 ENCOUNTER — Telehealth: Payer: Self-pay | Admitting: Family Medicine

## 2014-05-29 ENCOUNTER — Encounter: Payer: Self-pay | Admitting: Family Medicine

## 2014-05-29 MED ORDER — FERROUS SULFATE 325 (65 FE) MG PO TABS: 325.0000 mg | ORAL_TABLET | Freq: Every day | ORAL | Status: DC

## 2014-05-29 NOTE — Telephone Encounter (Signed)
Informed patient of message form MD, patient expressed understanding. 

## 2014-05-29 NOTE — Telephone Encounter (Signed)
Microcytic anemia, likely iron defficicnecy 2/2 menorrhagia. Will send Rx for iron and ask staff to explain, f/u 2 weeks as scheduled.    Murtis SinkSam Chiyoko Torrico, MD [facility] Resident, PGY-3 05/29/2014, 1:52 PM

## 2014-06-16 ENCOUNTER — Telehealth: Payer: Self-pay | Admitting: *Deleted

## 2014-06-16 ENCOUNTER — Ambulatory Visit (INDEPENDENT_AMBULATORY_CARE_PROVIDER_SITE_OTHER): Payer: Medicaid Other | Admitting: Family Medicine

## 2014-06-16 ENCOUNTER — Encounter: Payer: Self-pay | Admitting: Family Medicine

## 2014-06-16 ENCOUNTER — Other Ambulatory Visit (HOSPITAL_COMMUNITY)
Admission: RE | Admit: 2014-06-16 | Discharge: 2014-06-16 | Disposition: A | Discharge status: Home / Self Care | Payer: Medicaid Other | Source: Ambulatory Visit | Attending: Family Medicine | Admitting: Family Medicine

## 2014-06-16 VITALS — BP 136/93 | HR 73 | Temp 98.2°F | Ht 67.0 in | Wt 216.2 lb

## 2014-06-16 DIAGNOSIS — N920 Excessive and frequent menstruation with regular cycle: Secondary | ICD-10-CM | POA: Insufficient documentation

## 2014-06-16 DIAGNOSIS — Z23 Encounter for immunization: Secondary | ICD-10-CM

## 2014-06-16 DIAGNOSIS — Z0000 Encounter for general adult medical examination without abnormal findings: Secondary | ICD-10-CM | POA: Insufficient documentation

## 2014-06-16 DIAGNOSIS — Z01419 Encounter for gynecological examination (general) (routine) without abnormal findings: Secondary | ICD-10-CM | POA: Diagnosis not present

## 2014-06-16 DIAGNOSIS — D509 Iron deficiency anemia, unspecified: Secondary | ICD-10-CM

## 2014-06-16 DIAGNOSIS — F17200 Nicotine dependence, unspecified, uncomplicated: Secondary | ICD-10-CM

## 2014-06-16 DIAGNOSIS — Z1151 Encounter for screening for human papillomavirus (HPV): Secondary | ICD-10-CM | POA: Diagnosis present

## 2014-06-16 DIAGNOSIS — I10 Essential (primary) hypertension: Secondary | ICD-10-CM

## 2014-06-16 LAB — FERRITIN: Ferritin: 5 ng/mL — ABNORMAL LOW (ref 10–291)

## 2014-06-16 NOTE — Telephone Encounter (Signed)
LMOVM to call us back. I [facility] for [facility] @ 9:15 am. She has to have a full bladder, so drink water an hour before. Cerise Lieber CMA

## 2014-06-16 NOTE — Assessment & Plan Note (Addendum)
Pap  Today No concern for STI, s/p tubal

## 2014-06-16 NOTE — Assessment & Plan Note (Signed)
Improved Slight side effects of meds, likely due to relative hypotension Continue HCTZ and ACEi Follow up  3 months

## 2014-06-16 NOTE — Assessment & Plan Note (Signed)
Pap and TDap today

## 2014-06-16 NOTE — Progress Notes (Signed)
Patient ID: Melissa LenzJosephine Hesler, female   DOB: 08/12/1976, 38 y.o.   MRN: [id number]  Kevin FentonSamuel Baltazar Pekala, MD Phone: [phone]  Subjective:  Chief complaint-noted  Pt Here for followup hypertension and Pap smear  Hypertension Has been taking lisinopril and HCTZ every day Not checking blood pressure at home No headache, chest pain, dyspnea, leg edema, palpitations since our last talk She has had slight dizziness and fatigue during the day she carries to the medicine, if she takes the medicine at night to get away from the dizziness during the day she has a hard time sleeping due to 2 polyuria  Well woman exam, periods Has had 2 partners in the last 6 months, uses condoms inconsistently Periods last 6 days and she uses 7 pads per day. Happen every 4 weeks She describes her flow as heavy, Depo has not helped her much in the past 2 to exacerbations of what I believe she is describing is hidradenitis suppurativa She is a smoker and smokes about 6 cigarettes a day  Smoking has smoked for 15 years, has cut back quite a bit and would like to quit.  ROS-  Per HPI  Past Medical History Patient Active Problem List   Diagnosis Date Noted  . Menorrhagia 06/16/2014  . Healthcare maintenance 06/16/2014  . Syncope 05/25/2014  . Well woman exam 10/05/2011  . Obesity 10/05/2011  . TOBACCO USER 08/28/2009  . ANEMIA, IRON DEFICIENCY, UNSPEC. 01/17/2007  . HYPERTENSION, BENIGN SYSTEMIC 01/17/2007    Medications- reviewed and updated Current Outpatient Prescriptions  Medication Sig Dispense Refill  . ferrous sulfate 325 (65 FE) MG tablet Take 1 tablet (325 mg total) by mouth daily with breakfast.  30 tablet  3  . lisinopril-hydrochlorothiazide (PRINZIDE,ZESTORETIC) 20-25 MG per tablet Take 1 tablet by mouth daily.  30 tablet  2  . acetaminophen (TYLENOL) 500 MG tablet Take 500 mg by mouth every 6 (six) hours as needed. For pain      . HYDROcodone-acetaminophen (NORCO/VICODIN) 5-325 MG per  tablet Take 1 tablet by mouth every 6 (six) hours as needed for pain (Take 1 - 2 tablets every 4 - 6 hours.).  20 tablet  0  . methocarbamol (ROBAXIN) 750 MG tablet Take 1 tablet (750 mg total) by mouth 4 (four) times daily as needed (Take 1 tablet every 6 hours as needed for muscle spasms.).  20 tablet  0  . naproxen (NAPROSYN) 500 MG tablet Take 1 tablet (500 mg total) by mouth 2 (two) times daily as needed.  30 tablet  0   No current facility-administered medications for this visit.    Objective: BP 136/93  Pulse 73  Temp(Src) 98.2 F (36.8 C) (Oral)  Ht 5\' 7"  (1.702 m)  Wt 216 lb 3.2 oz (98.068 kg)  BMI 33.85 kg/m2  LMP 05/31/2014 Gen: NAD, alert, cooperative with exam HEENT: NCAT CV: RRR, good S1/S2, no murmur Resp: CTABL, no wheezes, non-labored Ext: No edema, warm Neuro: Alert and oriented, No gross deficits GU: Cervix normal appearing, vaginal walls well rugated and pink, minimal cervical discharge, no adnexal tenderness or fullness, area of firmness just posterior to the cervix that's nontender to palpation   Assessment/Plan:  HYPERTENSION, BENIGN SYSTEMIC Improved Slight side effects of meds, likely due to relative hypotension Continue HCTZ and ACEi Follow up  3 months  ANEMIA, IRON DEFICIENCY, UNSPEC. Microcytic anemia, likely iron defficiency from [employer] Start Iron Discussed options for controling flow, OCPs not an option due to smoking Discussed IUD, Depo - pt to  consider Pap today   Well woman exam Pap  Today No concern for STI, s/p tubal  Menorrhagia Microcytic anemia, periods appear to be on the heavy side but not grossly heavy Patient feels they're abnormal TSH WNL, no signs of insulin resistance Bimanual exam with firmness just posterior to the cervix but no adnexal fullness, suspicion for fibroid evaluate with transvaginal ultrasound, check ferritin  TOBACCO USER Wants to quit Recommended 1800 quit now, shed like to wait on other  pharmacologic support  Healthcare maintenance Pap and TDap today

## 2014-06-16 NOTE — Assessment & Plan Note (Addendum)
Microcytic anemia, periods appear to be on the heavy side but not grossly heavy Patient feels they're abnormal TSH WNL, no signs of insulin resistance Bimanual exam with firmness just posterior to the cervix but no adnexal fullness, suspicion for fibroid evaluate with transvaginal ultrasound, check ferritin

## 2014-06-16 NOTE — Patient Instructions (Signed)
Great to see you again!  I will check a lab and set up an US for your bleeding  There are options, we'll talk after the results  Palpitations A palpitation is the feeling that your heartbeat is irregular or is faster than normal. It may feel like your heart is fluttering or skipping a beat. Palpitations are usually not a serious problem. However, in some cases, you may need further medical evaluation. CAUSES  Palpitations can be caused by:  Smoking.  Caffeine or other stimulants, such as diet pills or energy drinks.  Alcohol.  Stress and anxiety.  Strenuous physical activity.  Fatigue.  Certain medicines.  Heart disease, especially if you have a history of irregular heart rhythms (arrhythmias), such as atrial fibrillation, atrial flutter, or supraventricular tachycardia.  An improperly working pacemaker or defibrillator. DIAGNOSIS  To find the cause of your palpitations, your health care provider will take your medical history and perform a physical exam. Your health care provider may also have you take a test called an ambulatory electrocardiogram (ECG). An ECG records your heartbeat patterns over a 24-hour period. You may also have other tests, such as:  Transthoracic echocardiogram (TTE). During echocardiography, sound waves are used to evaluate how blood flows through your heart.  Transesophageal echocardiogram (TEE).  Cardiac monitoring. This allows your health care provider to monitor your heart rate and rhythm in real time.  Holter monitor. This is a portable device that records your heartbeat and can help diagnose heart arrhythmias. It allows your health care provider to track your heart activity for several days, if needed.  Stress tests by exercise or by giving medicine that makes the heart beat faster. TREATMENT  Treatment of palpitations depends on the cause of your symptoms and can vary greatly. Most cases of palpitations do not require any treatment other than  time, relaxation, and monitoring your symptoms. Other causes, such as atrial fibrillation, atrial flutter, or supraventricular tachycardia, usually require further treatment. HOME CARE INSTRUCTIONS   Avoid:  Caffeinated coffee, tea, soft drinks, diet pills, and energy drinks.  Chocolate.  Alcohol.  Stop smoking if you smoke.  Reduce your stress and anxiety. Things that can help you relax include:  A method of controlling things in your body, such as your heartbeats, with your mind (biofeedback).  Yoga.  Meditation.  Physical activity such as swimming, jogging, or walking.  Get plenty of rest and sleep. SEEK MEDICAL CARE IF:   You continue to have a fast or irregular heartbeat beyond 24 hours.  Your palpitations occur more often. SEEK IMMEDIATE MEDICAL CARE IF:  You have chest pain or shortness of breath.  You have a severe headache.  You feel dizzy or you faint. MAKE SURE YOU:  Understand these instructions.  Will watch your condition.  Will get help right away if you are not doing well or get worse. Document Released: 11/03/2000 Document Revised: 11/11/2013 Document Reviewed: 01/05/2012 [facility] Patient Information 2015 [place]ExitCare, [place]LL[place]. This information is not intended to replace advice given to you by your health care provider. Make sure you discuss any questions you have with your health care provider.

## 2014-06-16 NOTE — Addendum Note (Signed)
Addended by: Pamelia HoitBLOUNT, DESEREE C on: 06/16/2014 09:36 AM   Modules accepted: Orders

## 2014-06-16 NOTE — Assessment & Plan Note (Signed)
Wants to quit Recommended 1800 quit now, shed like to wait on other pharmacologic support

## 2014-06-16 NOTE — Telephone Encounter (Signed)
Pt informed about appt. Blount, Deseree CMA

## 2014-06-16 NOTE — Assessment & Plan Note (Signed)
Microcytic anemia, likely iron defficiency from [employer] Start Iron Discussed options for controling flow, OCPs not an option due to smoking Discussed IUD, Depo - pt to consider Pap today

## 2014-06-16 NOTE — Addendum Note (Signed)
Addended by: Elenora GammaBRADSHAW, SAMUEL L on: 06/16/2014 09:43 AM   Modules accepted: Orders

## 2014-06-17 ENCOUNTER — Encounter: Payer: Self-pay | Admitting: Family Medicine

## 2014-06-17 LAB — CYTOLOGY - PAP

## 2014-06-18 ENCOUNTER — Encounter: Payer: Self-pay | Admitting: Family Medicine

## 2014-06-23 ENCOUNTER — Ambulatory Visit (HOSPITAL_COMMUNITY)
Admission: RE | Admit: 2014-06-23 | Discharge: 2014-06-23 | Disposition: A | Discharge status: Home / Self Care | Payer: Medicaid Other | Source: Ambulatory Visit | Attending: Family Medicine | Admitting: Family Medicine

## 2014-06-23 DIAGNOSIS — N72 Inflammatory disease of cervix uteri: Secondary | ICD-10-CM | POA: Insufficient documentation

## 2014-06-23 DIAGNOSIS — N920 Excessive and frequent menstruation with regular cycle: Secondary | ICD-10-CM | POA: Insufficient documentation

## 2014-06-25 ENCOUNTER — Encounter: Payer: Self-pay | Admitting: Family Medicine

## 2014-07-07 ENCOUNTER — Encounter: Payer: Self-pay | Admitting: *Deleted

## 2014-07-07 ENCOUNTER — Encounter: Payer: Self-pay | Admitting: Cardiovascular Disease

## 2014-07-08 ENCOUNTER — Ambulatory Visit (INDEPENDENT_AMBULATORY_CARE_PROVIDER_SITE_OTHER): Payer: Medicaid Other | Admitting: Cardiovascular Disease

## 2014-07-08 ENCOUNTER — Encounter: Payer: Self-pay | Admitting: Cardiovascular Disease

## 2014-07-08 VITALS — BP 120/82 | HR 80 | Ht 66.5 in | Wt 210.0 lb

## 2014-07-08 DIAGNOSIS — F17200 Nicotine dependence, unspecified, uncomplicated: Secondary | ICD-10-CM

## 2014-07-08 DIAGNOSIS — R011 Cardiac murmur, unspecified: Secondary | ICD-10-CM | POA: Insufficient documentation

## 2014-07-08 DIAGNOSIS — I10 Essential (primary) hypertension: Secondary | ICD-10-CM

## 2014-07-08 DIAGNOSIS — R55 Syncope and collapse: Secondary | ICD-10-CM

## 2014-07-08 NOTE — Progress Notes (Signed)
Patient ID: Leisa LenzJosephine Regina, female   DOB: 03/01/1976, 38 y.o.   MRN: [id number]  38 yo referred by family practice for syncope.  A few weeks ago around 7:00 pm ran up stairs after arguing with children and "passed" out for a few seconds.  Tired but got right up.  No palpitations chest pain or dyspnea.  No previous episodes.  No history of drug use.  No hypoglycemia or seizure history Does have history of murmur.  Maternal grandmother had heart issues from Lupus No high risk family history of sudden death.  Smokes  Has not tried to quit outside of having kids.  1ppd.  No cough , wheezing or fever.  Sedentary.  Has not had recurrence.  Was under a lot of stress at the time     ROS: Denies fever, malais, weight loss, blurry vision, decreased visual acuity, cough, sputum, SOB, hemoptysis, pleuritic pain, palpitaitons, heartburn, abdominal pain, melena, lower extremity edema, claudication, or rash.  All other systems reviewed and negative   General: Affect appropriate Healthy:  appears stated age HEENT: normal Neck supple with no adenopathy JVP normal no bruits no thyromegaly Lungs clear with no wheezing and good diaphragmatic motion Heart:  S1/S2 1/6 SEM  murmur,rub, gallop or click PMI normal Abdomen: benighn, BS positve, no tenderness, no AAA no bruit.  No HSM or HJR Distal pulses intact with no bruits No edema Neuro non-focal Skin warm and dry No muscular weakness  Medications Current Outpatient Prescriptions  Medication Sig Dispense Refill  . acetaminophen (TYLENOL) 500 MG tablet Take 500 mg by mouth every 6 (six) hours as needed. For pain      . ferrous sulfate 325 (65 FE) MG tablet Take 1 tablet (325 mg total) by mouth daily with breakfast.  30 tablet  3  . HYDROcodone-acetaminophen (NORCO/VICODIN) 5-325 MG per tablet Take 1 tablet by mouth every 6 (six) hours as needed for pain (Take 1 - 2 tablets every 4 - 6 hours.).  20 tablet  0  . lisinopril-hydrochlorothiazide  (PRINZIDE,ZESTORETIC) 20-25 MG per tablet Take 1 tablet by mouth daily.  30 tablet  2  . methocarbamol (ROBAXIN) 750 MG tablet Take 1 tablet (750 mg total) by mouth 4 (four) times daily as needed (Take 1 tablet every 6 hours as needed for muscle spasms.).  20 tablet  0  . naproxen (NAPROSYN) 500 MG tablet Take 1 tablet (500 mg total) by mouth 2 (two) times daily as needed.  30 tablet  0   No current facility-administered medications for this visit.    Allergies Review of patient's allergies indicates no known allergies.  Family History: Family History  Problem Relation Age of Onset  . Hypertension Mother   . Hyperlipidemia Mother   . Diabetes Father   . Alcohol abuse Father   . Lupus Sister     Social History: History   Social History  . Marital Status: Single    Spouse Name: N/A    Number of Children: N/A  . Years of Education: N/A   Occupational History  . Not on file.   Social History Main Topics  . Smoking status: Current Every Day Smoker -- 0.25 packs/day    Types: Cigarettes  . Smokeless tobacco: Never Used  . Alcohol Use: Yes  . Drug Use: Yes    Special: Marijuana  . Sexual Activity: Yes    Birth Control/ Protection: Condom   Other Topics Concern  . Not on file   Social History Narrative  .  No narrative on file    Electrocardiogram:  7/15  SR normal ECG   Assessment and Plan

## 2014-07-08 NOTE — Patient Instructions (Signed)
Your physician has requested that you have an echocardiogram. Echocardiography is a painless test that uses sound waves to create images of your heart. It provides your doctor with information about the size and shape of your heart and how well your heart's chambers and valves are working. This procedure takes approximately one hour. There are no restrictions for this procedure.  Your physician recommends that you schedule a follow-up appointment as needed.  

## 2014-07-08 NOTE — Assessment & Plan Note (Signed)
Likely benign flow murmur  F/u echo No SBE

## 2014-07-08 NOTE — Assessment & Plan Note (Signed)
Well controlled.  Continue current medications and low sodium Dash type diet.    

## 2014-07-08 NOTE — Assessment & Plan Note (Signed)
Smoking cessation instruction/counseling given:  counseled patient on the dangers of tobacco use, advised patient to stop smoking, and reviewed strategies to maximize success  She would be a good candidate for welbutrin and nicotine replacement f/u family practice

## 2014-07-08 NOTE — Assessment & Plan Note (Signed)
Doubt cardiac etiology  With history of and soft murmur  F/u echo

## 2014-07-14 ENCOUNTER — Ambulatory Visit (HOSPITAL_COMMUNITY): Payer: Medicaid Other | Attending: Cardiology | Admitting: Radiology

## 2014-07-14 DIAGNOSIS — R011 Cardiac murmur, unspecified: Secondary | ICD-10-CM | POA: Diagnosis not present

## 2014-07-14 DIAGNOSIS — R55 Syncope and collapse: Secondary | ICD-10-CM | POA: Diagnosis present

## 2014-07-14 NOTE — Progress Notes (Signed)
Echocardiogram performed.  

## 2015-04-03 ENCOUNTER — Inpatient Hospital Stay (HOSPITAL_COMMUNITY)
Admission: EM | Admit: 2015-04-03 | Discharge: 2015-04-04 | DRG: 176 | Disposition: A | Discharge status: Home / Self Care | Payer: Medicaid Other | Attending: Internal Medicine | Admitting: Internal Medicine

## 2015-04-03 ENCOUNTER — Emergency Department (HOSPITAL_COMMUNITY): Payer: Medicaid Other

## 2015-04-03 ENCOUNTER — Encounter (HOSPITAL_COMMUNITY): Payer: Self-pay | Admitting: Emergency Medicine

## 2015-04-03 DIAGNOSIS — R079 Chest pain, unspecified: Secondary | ICD-10-CM | POA: Insufficient documentation

## 2015-04-03 DIAGNOSIS — I10 Essential (primary) hypertension: Secondary | ICD-10-CM | POA: Diagnosis present

## 2015-04-03 DIAGNOSIS — E871 Hypo-osmolality and hyponatremia: Secondary | ICD-10-CM | POA: Diagnosis present

## 2015-04-03 DIAGNOSIS — E669 Obesity, unspecified: Secondary | ICD-10-CM | POA: Diagnosis present

## 2015-04-03 DIAGNOSIS — D509 Iron deficiency anemia, unspecified: Secondary | ICD-10-CM | POA: Diagnosis present

## 2015-04-03 DIAGNOSIS — I2699 Other pulmonary embolism without acute cor pulmonale: Principal | ICD-10-CM | POA: Diagnosis present

## 2015-04-03 DIAGNOSIS — Z8249 Family history of ischemic heart disease and other diseases of the circulatory system: Secondary | ICD-10-CM | POA: Diagnosis not present

## 2015-04-03 DIAGNOSIS — D72829 Elevated white blood cell count, unspecified: Secondary | ICD-10-CM | POA: Diagnosis present

## 2015-04-03 DIAGNOSIS — R0781 Pleurodynia: Secondary | ICD-10-CM | POA: Diagnosis present

## 2015-04-03 DIAGNOSIS — E876 Hypokalemia: Secondary | ICD-10-CM | POA: Insufficient documentation

## 2015-04-03 DIAGNOSIS — N920 Excessive and frequent menstruation with regular cycle: Secondary | ICD-10-CM | POA: Diagnosis present

## 2015-04-03 DIAGNOSIS — Z791 Long term (current) use of non-steroidal anti-inflammatories (NSAID): Secondary | ICD-10-CM | POA: Diagnosis not present

## 2015-04-03 DIAGNOSIS — F17210 Nicotine dependence, cigarettes, uncomplicated: Secondary | ICD-10-CM | POA: Diagnosis present

## 2015-04-03 DIAGNOSIS — Z833 Family history of diabetes mellitus: Secondary | ICD-10-CM | POA: Diagnosis not present

## 2015-04-03 DIAGNOSIS — R071 Chest pain on breathing: Secondary | ICD-10-CM | POA: Diagnosis not present

## 2015-04-03 DIAGNOSIS — Z79899 Other long term (current) drug therapy: Secondary | ICD-10-CM

## 2015-04-03 LAB — CBC WITH DIFFERENTIAL/PLATELET
BASOS ABS: 0 10*3/uL (ref 0.0–0.1)
Basophils Relative: 0 % (ref 0–1)
EOS PCT: 1 % (ref 0–5)
Eosinophils Absolute: 0.1 10*3/uL (ref 0.0–0.7)
HCT: 30.7 % — ABNORMAL LOW (ref 36.0–46.0)
HEMOGLOBIN: 9.8 g/dL — AB (ref 12.0–15.0)
LYMPHS ABS: 3.3 10*3/uL (ref 0.7–4.0)
Lymphocytes Relative: 25 % (ref 12–46)
MCH: 21.6 pg — ABNORMAL LOW (ref 26.0–34.0)
MCHC: 31.9 g/dL (ref 30.0–36.0)
MCV: 67.6 fL — ABNORMAL LOW (ref 78.0–100.0)
Monocytes Absolute: 1.1 10*3/uL — ABNORMAL HIGH (ref 0.1–1.0)
Monocytes Relative: 8 % (ref 3–12)
NEUTROS ABS: 8.8 10*3/uL — AB (ref 1.7–7.7)
NEUTROS PCT: 66 % (ref 43–77)
PLATELETS: 374 10*3/uL (ref 150–400)
RBC: 4.54 MIL/uL (ref 3.87–5.11)
RDW: 19.1 % — ABNORMAL HIGH (ref 11.5–15.5)
WBC: 13.3 10*3/uL — ABNORMAL HIGH (ref 4.0–10.5)

## 2015-04-03 LAB — I-STAT CHEM 8, ED
BUN: 7 mg/dL (ref 6–20)
CREATININE: 0.8 mg/dL (ref 0.44–1.00)
Calcium, Ion: 1.27 mmol/L — ABNORMAL HIGH (ref 1.12–1.23)
Chloride: 100 mmol/L — ABNORMAL LOW (ref 101–111)
GLUCOSE: 95 mg/dL (ref 65–99)
HEMATOCRIT: 36 % (ref 36.0–46.0)
Hemoglobin: 12.2 g/dL (ref 12.0–15.0)
Potassium: 3.2 mmol/L — ABNORMAL LOW (ref 3.5–5.1)
Sodium: 139 mmol/L (ref 135–145)
TCO2: 22 mmol/L (ref 0–100)

## 2015-04-03 LAB — PROTIME-INR
INR: 1.02 (ref 0.00–1.49)
PROTHROMBIN TIME: 13.5 s (ref 11.6–15.2)

## 2015-04-03 LAB — I-STAT TROPONIN, ED: Troponin i, poc: 0 ng/mL (ref 0.00–0.08)

## 2015-04-03 LAB — I-STAT BETA HCG BLOOD, ED (MC, WL, AP ONLY)

## 2015-04-03 MED ORDER — POTASSIUM CHLORIDE CRYS ER 20 MEQ PO TBCR
40.0000 meq | EXTENDED_RELEASE_TABLET | Freq: Once | ORAL | Status: AC
  Administered 2015-04-03: 40 meq via ORAL
  Filled 2015-04-03: qty 2

## 2015-04-03 MED ORDER — KETOROLAC TROMETHAMINE 30 MG/ML IJ SOLN
30.0000 mg | Freq: Once | INTRAMUSCULAR | Status: AC
  Administered 2015-04-03: 30 mg via INTRAVENOUS
  Filled 2015-04-03: qty 1

## 2015-04-03 MED ORDER — IOHEXOL 350 MG/ML SOLN
100.0000 mL | Freq: Once | INTRAVENOUS | Status: AC | PRN
  Administered 2015-04-03: 100 mL via INTRAVENOUS

## 2015-04-03 MED ORDER — MORPHINE SULFATE 4 MG/ML IJ SOLN: 4.0000 mg | Freq: Once | INTRAMUSCULAR | Status: DC

## 2015-04-03 MED ORDER — SODIUM CHLORIDE 0.9 % IV BOLUS (SEPSIS)
1000.0000 mL | Freq: Once | INTRAVENOUS | Status: AC
  Administered 2015-04-03: 1000 mL via INTRAVENOUS

## 2015-04-03 NOTE — ED Notes (Signed)
Pt c/o L chest pain, under breast onset yesterday, sharp squeezing. Pt states initially pain was radiating around to back now staying front. Pt states when pain occurs feels like it is difficult to breath.

## 2015-04-03 NOTE — ED Provider Notes (Signed)
CSN: [id number]     Arrival date & time 04/03/15  2057 History   First MD Initiated Contact with Patient 04/03/15 2122     Chief Complaint  Patient presents with  . Chest Pain     (Consider location/radiation/quality/duration/timing/severity/associated sxs/prior Treatment) HPI  Melissa Krueger is a 39 y.o. female with past medical history of hypertension, anemia, syncope presenting today with chest pain. Patient states that her pain is left-sided and sharp. She denies any radiation to me. She states this began last night while watching TV. She states it got worse today and now is typical for her to breathe. The pain is pleuritic. She also admits to right lower extremity swelling and calf tenderness over the last week which is now resolved. She denies any history of heart attack or blood clots in the past. She denies recent travel, estrogen use, or recent surgeries. She's had no hemoptysis. Patient denies any nausea vomiting or diaphoresis. She has no further complaints. Patient has had no trauma to the area.  10 Systems reviewed and are negative for acute change except as noted in the HPI.     Past Medical History  Diagnosis Date  . Hypertension   . Anemia   . Syncope   . Obesity   . Menorrhagia    Past Surgical History  Procedure Laterality Date  . Tubal ligation    . Breast surgery     Family History  Problem Relation Age of Onset  . Hypertension Mother   . Hyperlipidemia Mother   . Diabetes Father   . Alcohol abuse Father   . Lupus Sister    History  Substance Use Topics  . Smoking status: Current Every Day Smoker -- 0.25 packs/day    Types: Cigarettes  . Smokeless tobacco: Never Used  . Alcohol Use: Yes   OB History    No data available     Review of Systems    Allergies  Review of patient's allergies indicates no known allergies.  Home Medications   Prior to Admission medications   Medication Sig Start Date End Date Taking? Authorizing Provider   ferrous sulfate 325 (65 FE) MG tablet Take 1 tablet (325 mg total) by mouth daily with breakfast. 05/29/14  Yes Elenora GammaSamuel L Bradshaw, MD  ibuprofen (ADVIL,MOTRIN) 200 MG tablet Take 600 mg by mouth every 8 (eight) hours as needed (for pain.).   Yes Historical Provider, MD  lisinopril-hydrochlorothiazide (PRINZIDE,ZESTORETIC) 20-25 MG per tablet Take 1 tablet by mouth daily. 05/25/14  Yes Elenora GammaSamuel L Bradshaw, MD  Pyridoxine HCl (VITAMIN B-6 PO) Take 1 tablet by mouth daily.   Yes Historical Provider, MD  vitamin C (ASCORBIC ACID) 500 MG tablet Take 500 mg by mouth daily.   Yes Historical Provider, MD  naproxen (NAPROSYN) 500 MG tablet Take 1 tablet (500 mg total) by mouth 2 (two) times daily as needed. Patient not taking: Reported on 04/03/2015 12/21/12   Dahlia ClientHannah Muthersbaugh, PA-C   BP 108/66 mmHg  Pulse 93  Temp(Src) 98.6 F (37 C) (Oral)  Resp 20  Ht 5\' 6"  (1.676 m)  Wt 215 lb (97.523 kg)  BMI 34.72 kg/m2  SpO2 99%  LMP 04/03/2015 Physical Exam  Constitutional: She is oriented to person, place, and time. She appears well-developed and well-nourished. No distress.  HENT:  Head: Normocephalic and atraumatic.  Nose: Nose normal.  Mouth/Throat: Oropharynx is clear and moist. No oropharyngeal exudate.  Eyes: Conjunctivae and EOM are normal. Pupils are equal, round, and reactive to light. No  scleral icterus.  Neck: Normal range of motion. Neck supple. No JVD present. No tracheal deviation present. No thyromegaly present.  Cardiovascular: Normal rate, regular rhythm and normal heart sounds.  Exam reveals no gallop and no friction rub.   No murmur heard. Pulmonary/Chest: Effort normal and breath sounds normal. No respiratory distress. She has no wheezes. She exhibits no tenderness.  Abdominal: Soft. Bowel sounds are normal. She exhibits no distension and no mass. There is no tenderness. There is no rebound and no guarding.  Musculoskeletal: Normal range of motion. She exhibits no edema or tenderness.   Lymphadenopathy:    She has no cervical adenopathy.  Neurological: She is alert and oriented to person, place, and time. No cranial nerve deficit. She exhibits normal muscle tone.  Skin: Skin is warm and dry. No rash noted. She is not diaphoretic. No erythema. No pallor.  Nursing note and vitals reviewed.   ED Course  Procedures (including critical care time) Labs Review Labs Reviewed  CBC WITH DIFFERENTIAL/PLATELET - Abnormal; Notable for the following:    WBC 13.3 (*)    Hemoglobin 9.8 (*)    HCT 30.7 (*)    MCV 67.6 (*)    MCH 21.6 (*)    RDW 19.1 (*)    Neutro Abs 8.8 (*)    Monocytes Absolute 1.1 (*)    All other components within normal limits  I-STAT CHEM 8, ED - Abnormal; Notable for the following:    Potassium 3.2 (*)    Chloride 100 (*)    Calcium, Ion 1.27 (*)    All other components within normal limits  PROTIME-INR  I-STAT TROPOININ, ED  I-STAT BETA HCG BLOOD, ED ([facility], [facility], [facility] ONLY)    Imaging Review Ct Angio Chest Pe W/cm &/or Wo Cm  04/03/2015   CLINICAL DATA:  Acute onset of left-sided chest pain and squeezing sensation. Pain radiates to the back. Difficulty breathing. Initial encounter.  EXAM: CT ANGIOGRAPHY CHEST WITH CONTRAST  TECHNIQUE: Multidetector CT imaging of the chest was performed using the standard protocol during bolus administration of intravenous contrast. Multiplanar CT image reconstructions and MIPs were obtained to evaluate the vascular anatomy.  CONTRAST:  OMNIPAQUE IOHEXOL 350 MG/ML SOLN  COMPARISON:  Chest radiograph performed 09/15/2005  FINDINGS: There is suggestion of a tiny pulmonary embolus within a subsegmental branch to the left lower lobe. Patchy airspace opacity within the left lower lobe is suggestive of mild pulmonary infarct and atelectasis. A trace left pleural effusion is noted.  The remainder of the lungs are clear. There is no evidence of significant focal consolidation, pleural effusion or pneumothorax. No masses are  identified; no abnormal focal contrast enhancement is seen.  The mediastinum is unremarkable in appearance. No mediastinal lymphadenopathy is seen. No pericardial effusion is identified. The great vessels are grossly unremarkable in appearance. No axillary lymphadenopathy is seen. The visualized portions of the thyroid gland are unremarkable in appearance.  The visualized portions of the liver and spleen are unremarkable. The visualized portions of the pancreas, gallbladder, stomach, adrenal glands and kidneys are within normal limits. A large calcification is noted within the right breast, likely benign in nature.  No acute osseous abnormalities are seen.  Review of the MIP images confirms the above findings.  IMPRESSION: Suggestion of tiny pulmonary embolus within a subsegmental branch to the left lower lung lobe. Patchy airspace opacity within the left lower lobe is suggestive of mild pulmonary infarct and atelectasis. Trace left pleural effusion noted.  Critical Value/emergent  results were called by telephone at the time of interpretation on 04/03/2015 at 11:15 pm to Dr. Tomasita CrumbleADELEKE Ayoub Arey, who verbally acknowledged these results.   Electronically Signed   By: Roanna RaiderJeffery  Chang M.D.   On: 04/03/2015 23:16     EKG Interpretation   Date/Time:  Saturday Apr 03 2015 21:11:19 EDT Ventricular Rate:  93 PR Interval:  158 QRS Duration: 88 QT Interval:  381 QTC Calculation: 474 R Axis:   56 Text Interpretation:  Sinus rhythm Baseline wander in lead(s) II aVR aVF  No significant change since last tracing Confirmed by Erroll Lunani, Micajah Dennin  [place] [phone]([id number]) on 04/03/2015 9:21:43 PM      MDM   Final diagnoses:  Chest pain    Patient since emergency department for chest pain on the left side. Pain is pleuritic in patient with history of recent swelling and pain in her calf. I'm concerned for possible pulmonary embolism. Will obtain CT scan the chest for evaluation. Patient was given Toradol for pain, IVF were also  given.   CT angio reveals a tiny subsegmental PE in the LLL.  There is an associated area of infarct, likely causing her pain.  CT scan shows area of infarct that appears slightly more impressive to be, but I agree that it is small.  Patient will be admitted for observation of PE to ensure it does not get bigger.  I spoke with [facility] hospitalist who will admit patient to telemetry.   Tomasita CrumbleAdeleke Javonne Dorko, MD 04/03/15 2328

## 2015-04-03 NOTE — H&P (Signed)
PCP:  Kevin Fenton, MD    Referring provider ONi   Chief Complaint:  Chest pain  HPI: Melissa Krueger is a 39 y.o. female   has a past medical history of Hypertension; Anemia; Syncope; Obesity; and Menorrhagia.   Presented with chest pain for the past 24 hours worse with inspiration. Prior to that she had significant right leg pain and cramps. She denies any hx of DVT or PE, no family hx of clotting disorder. NO recent trips.    Hospitalist was called for admission for PE  Review of Systems:    Pertinent positives include:  chest pain,  Constitutional:  No weight loss, night sweats, Fevers, chills, fatigue, weight loss  HEENT:  No headaches, Difficulty swallowing,Tooth/dental problems,Sore throat,  No sneezing, itching, ear ache, nasal congestion, post nasal drip,  Cardio-vascular:  No  Orthopnea, PND, anasarca, dizziness, palpitations.no Bilateral lower extremity swelling  GI:  No heartburn, indigestion, abdominal pain, nausea, vomiting, diarrhea, change in bowel habits, loss of appetite, melena, blood in stool, hematemesis Resp:  no shortness of breath at rest. No dyspnea on exertion, No excess mucus, no productive cough, No non-productive cough, No coughing up of blood.No change in color of mucus.No wheezing. Skin:  no rash or lesions. No jaundice GU:  no dysuria, change in color of urine, no urgency or frequency. No straining to urinate.  No flank pain.  Musculoskeletal:  No joint pain or no joint swelling. No decreased range of motion. No back pain.  Psych:  No change in mood or affect. No depression or anxiety. No memory loss.  Neuro: no localizing neurological complaints, no tingling, no weakness, no double vision, no gait abnormality, no slurred speech, no confusion  Otherwise ROS are negative except for above, 10 systems were reviewed  Past Medical History: Past Medical History  Diagnosis Date  . Hypertension   . Anemia   . Syncope   . Obesity    . Menorrhagia    Past Surgical History  Procedure Laterality Date  . Tubal ligation    . Breast surgery       Medications: Prior to Admission medications   Medication Sig Start Date End Date Taking? Authorizing Provider  ferrous sulfate 325 (65 FE) MG tablet Take 1 tablet (325 mg total) by mouth daily with breakfast. 05/29/14  Yes Elenora Gamma, MD  ibuprofen (ADVIL,MOTRIN) 200 MG tablet Take 600 mg by mouth every 8 (eight) hours as needed (for pain.).   Yes Historical Provider, MD  lisinopril-hydrochlorothiazide (PRINZIDE,ZESTORETIC) 20-25 MG per tablet Take 1 tablet by mouth daily. 05/25/14  Yes Elenora Gamma, MD  Pyridoxine HCl (VITAMIN B-6 PO) Take 1 tablet by mouth daily.   Yes Historical Provider, MD  vitamin C (ASCORBIC ACID) 500 MG tablet Take 500 mg by mouth daily.   Yes Historical Provider, MD  naproxen (NAPROSYN) 500 MG tablet Take 1 tablet (500 mg total) by mouth 2 (two) times daily as needed. Patient not taking: Reported on 04/03/2015 12/21/12   Dahlia Client Muthersbaugh, PA-C    Allergies:  No Known Allergies  Social History:  Ambulatory  independently   Lives at home   With family     reports that she has been smoking Cigarettes.  She has been smoking about 0.25 packs per day. She has never used smokeless tobacco. She reports that she drinks alcohol. She reports that she uses illicit drugs (Marijuana).    Family History: family history includes Alcohol abuse in her father; Diabetes in her father;  Hyperlipidemia in her mother; Hypertension in her mother; Lupus in her sister.    Physical Exam: Patient Vitals for the past 24 hrs:  BP Temp Temp src Pulse Resp SpO2 Height Weight  04/03/15 2107 108/66 mmHg 98.6 F (37 C) Oral 93 20 99 %  (1.676 m) 97.523 kg (215 lb)    1. General:  in No Acute distress 2. Psychological: Alert and  Oriented 3. Head/ENT:   Moist   Mucous Membranes                          Head Non traumatic, neck supple                           Normal   Dentition 4. SKIN: normal  Skin turgor,  Skin clean Dry and intact no rash 5. Heart: Regular rate and rhythm no Murmur, Rub or gallop 6. Lungs: Clear to auscultation bilaterally, no wheezes or crackles  diminished effort due to pain 7. Abdomen: Soft, non-tender, Non distended 8. Lower extremities: no clubbing, cyanosis, or edema 9. Neurologically Grossly intact, moving all 4 extremities equally 10. MSK: Normal range of motion  body mass index is 34.72 kg/(m^2).   Labs on Admission:   Results for orders placed or performed during the hospital encounter of 04/03/15 (from the past 24 hour(s))  CBC with Differential/Platelet     Status: Abnormal   Collection Time: 04/03/15 10:04 PM  Result Value Ref Range   WBC 13.3 (H) 4.0 - 10.5 K/uL   RBC 4.54 3.87 - 5.11 MIL/uL   Hemoglobin 9.8 (L) 12.0 - 15.0 g/dL   HCT 96.0 (L) 45.4 - 09.8 %   MCV 67.6 (L) 78.0 - 100.0 fL   MCH 21.6 (L) 26.0 - 34.0 pg   MCHC 31.9 30.0 - 36.0 g/dL   RDW 11.9 (H) 14.7 - 82.9 %   Platelets 374 150 - 400 K/uL   Neutrophils Relative % 66 43 - 77 %   Lymphocytes Relative 25 12 - 46 %   Monocytes Relative 8 3 - 12 %   Eosinophils Relative 1 0 - 5 %   Basophils Relative 0 0 - 1 %   Neutro Abs 8.8 (H) 1.7 - 7.7 K/uL   Lymphs Abs 3.3 0.7 - 4.0 K/uL   Monocytes Absolute 1.1 (H) 0.1 - 1.0 K/uL   Eosinophils Absolute 0.1 0.0 - 0.7 K/uL   Basophils Absolute 0.0 0.0 - 0.1 K/uL   RBC Morphology POLYCHROMASIA PRESENT   Protime-INR     Status: None   Collection Time: 04/03/15 10:04 PM  Result Value Ref Range   Prothrombin Time 13.5 11.6 - 15.2 seconds   INR 1.02 0.00 - 1.49  I-stat troponin, ED     Status: None   Collection Time: 04/03/15 10:08 PM  Result Value Ref Range   Troponin i, poc 0.00 0.00 - 0.08 ng/mL   Comment 3          I-stat chem 8, ed     Status: Abnormal   Collection Time: 04/03/15 10:10 PM  Result Value Ref Range   Sodium 139 135 - 145 mmol/L   Potassium 3.2 (L) 3.5 - 5.1 mmol/L    Chloride 100 (L) 101 - 111 mmol/L   BUN 7 6 - 20 mg/dL   Creatinine, Ser 5.62 0.44 - 1.00 mg/dL   Glucose, Bld 95 65 - 99 mg/dL  Calcium, Ion 1.27 (H) 1.12 - 1.23 mmol/L   TCO2 22 0 - 100 mmol/L   Hemoglobin 12.2 12.0 - 15.0 g/dL   HCT 16.136.0 09.636.0 - 04.546.0 %  I-Stat Beta hCG blood, ED ([facility], [facility], [facility] only)     Status: None   Collection Time: 04/03/15 10:37 PM  Result Value Ref Range   I-stat hCG, quantitative <5.0 <5 mIU/mL   Comment 3            UA not obtained  No results found for: HGBA1C  Estimated Creatinine Clearance: 114.5 mL/min (by C-G formula based on Cr of 0.8).  BNP (last 3 results) No results for input(s): PROBNP in the last 8760 hours.  Other results:  I have pearsonaly reviewed this: ECG REPORT  Rate: 93  Rhythm: Sinus rhythm ST&T Change: No ischemic changes QTC 474  Filed Weights   04/03/15 2107  Weight: 97.523 kg (215 lb)     Cultures: No results found for: SDES, SPECREQUEST, CULT, REPTSTATUS   Radiological Exams on Admission: Ct Angio Chest Pe W/cm &/or Wo Cm  04/03/2015   CLINICAL DATA:  Acute onset of left-sided chest pain and squeezing sensation. Pain radiates to the back. Difficulty breathing. Initial encounter.  EXAM: CT ANGIOGRAPHY CHEST WITH CONTRAST  TECHNIQUE: Multidetector CT imaging of the chest was performed using the standard protocol during bolus administration of intravenous contrast. Multiplanar CT image reconstructions and MIPs were obtained to evaluate the vascular anatomy.  CONTRAST:  100mL OMNIPAQUE IOHEXOL 350 MG/ML SOLN  COMPARISON:  Chest radiograph performed 09/15/2005  FINDINGS: There is suggestion of a tiny pulmonary embolus within a subsegmental branch to the left lower lobe. Patchy airspace opacity within the left lower lobe is suggestive of mild pulmonary infarct and atelectasis. A trace left pleural effusion is noted.  The remainder of the lungs are clear. There is no evidence of significant focal consolidation, pleural effusion or  pneumothorax. No masses are identified; no abnormal focal contrast enhancement is seen.  The mediastinum is unremarkable in appearance. No mediastinal lymphadenopathy is seen. No pericardial effusion is identified. The great vessels are grossly unremarkable in appearance. No axillary lymphadenopathy is seen. The visualized portions of the thyroid gland are unremarkable in appearance.  The visualized portions of the liver and spleen are unremarkable. The visualized portions of the pancreas, gallbladder, stomach, adrenal glands and kidneys are within normal limits. A large calcification is noted within the right breast, likely benign in nature.  No acute osseous abnormalities are seen.  Review of the MIP images confirms the above findings.  IMPRESSION: Suggestion of tiny pulmonary embolus within a subsegmental branch to the left lower lung lobe. Patchy airspace opacity within the left lower lobe is suggestive of mild pulmonary infarct and atelectasis. Trace left pleural effusion noted.  Critical Value/emergent results were called by telephone at the time of interpretation on 04/03/2015 at 11:15 pm to Dr. Tomasita CrumbleADELEKE ONI, who verbally acknowledged these results.   Electronically Signed   By: Roanna RaiderJeffery  Chang M.D.   On: 04/03/2015 23:16    Chart has been reviewed  Family   at  Bedside  plan of care was discussed with  Daughter,   Assessment/Plan  39 year old female with history of obesity and hypertension presents with pleuritic chest pain was found to have small PE  Present on Admission:  . Pulmonary embolus - small but there is evidence of likely pulmonary infarct, we'll initiate heparin and Coumadin. Patient has history of metrorrhagia and currently having menstrual period. Obtain  echo hypercoagulable panel, troponin, echo gram, monitor on telemetry, hold blood pressure medications, rehydrate most likely risk factor obesity  . Iron deficiency anemia - continue iron supplements monitor CBC while initiating blood  thinners  . HYPERTENSION, BENIGN SYSTEMIC holding lisinopril hydrochlorothiazide allowing permissive hypertension given recent diagnosis of pulmonary embolism  . hypokalemia has been replaced by emergency department     Prophylaxis:   Lovenox   CODE STATUS:  FULL CODE  as per patient    Disposition:  To home once workup is complete and patient is stable  Other plan as per orders.  I have spent a total of 55 min on this admission  Chrisma Hurlock 04/03/2015, 11:51 PM  [facility]  Pager [phone]   after 2 AM please page floor coverage PA If 7AM-7PM, please contact the day team taking care of the patient  [email or web address]  Password [email or web address]

## 2015-04-04 ENCOUNTER — Inpatient Hospital Stay (HOSPITAL_COMMUNITY): Payer: Medicaid Other

## 2015-04-04 ENCOUNTER — Encounter (HOSPITAL_COMMUNITY): Payer: Self-pay | Admitting: *Deleted

## 2015-04-04 DIAGNOSIS — I2699 Other pulmonary embolism without acute cor pulmonale: Secondary | ICD-10-CM | POA: Insufficient documentation

## 2015-04-04 DIAGNOSIS — E871 Hypo-osmolality and hyponatremia: Secondary | ICD-10-CM | POA: Diagnosis present

## 2015-04-04 DIAGNOSIS — R079 Chest pain, unspecified: Secondary | ICD-10-CM | POA: Insufficient documentation

## 2015-04-04 DIAGNOSIS — I10 Essential (primary) hypertension: Secondary | ICD-10-CM

## 2015-04-04 DIAGNOSIS — D509 Iron deficiency anemia, unspecified: Secondary | ICD-10-CM

## 2015-04-04 DIAGNOSIS — E876 Hypokalemia: Secondary | ICD-10-CM | POA: Insufficient documentation

## 2015-04-04 LAB — COMPREHENSIVE METABOLIC PANEL
ALBUMIN: 3 g/dL — AB (ref 3.5–5.0)
ALT: 9 U/L — AB (ref 14–54)
AST: 16 U/L (ref 15–41)
Alkaline Phosphatase: 58 U/L (ref 38–126)
Anion gap: 9 (ref 5–15)
BUN: 7 mg/dL (ref 6–20)
CALCIUM: 8.7 mg/dL — AB (ref 8.9–10.3)
CO2: 25 mmol/L (ref 22–32)
Chloride: 107 mmol/L (ref 101–111)
Creatinine, Ser: 0.7 mg/dL (ref 0.44–1.00)
GFR calc Af Amer: >60 mL/min (ref >60)
Glucose, Bld: 93 mg/dL (ref 65–99)
POTASSIUM: 3.8 mmol/L (ref 3.5–5.1)
SODIUM: 141 mmol/L (ref 135–145)
TOTAL PROTEIN: 6.8 g/dL (ref 6.5–8.1)
Total Bilirubin: 0.7 mg/dL (ref 0.3–1.2)

## 2015-04-04 LAB — PHOSPHORUS: Phosphorus: 3.6 mg/dL (ref 2.5–4.6)

## 2015-04-04 LAB — CBC
HCT: 29.7 % — ABNORMAL LOW (ref 36.0–46.0)
HEMOGLOBIN: 9.2 g/dL — AB (ref 12.0–15.0)
MCH: 21.2 pg — AB (ref 26.0–34.0)
MCHC: 31 g/dL (ref 30.0–36.0)
MCV: 68.6 fL — AB (ref 78.0–100.0)
PLATELETS: 352 10*3/uL (ref 150–400)
RBC: 4.33 MIL/uL (ref 3.87–5.11)
RDW: 19.6 % — AB (ref 11.5–15.5)
WBC: 11.7 10*3/uL — ABNORMAL HIGH (ref 4.0–10.5)

## 2015-04-04 LAB — TROPONIN I: Troponin I: <0.03 ng/mL (ref <0.031)

## 2015-04-04 LAB — ANTITHROMBIN III: AntiThromb III Func: 90 % (ref 75–120)

## 2015-04-04 LAB — TSH: TSH: 1.31 u[IU]/mL (ref 0.350–4.500)

## 2015-04-04 LAB — MAGNESIUM: Magnesium: 1.8 mg/dL (ref 1.7–2.4)

## 2015-04-04 MED ORDER — HEPARIN BOLUS VIA INFUSION
3000.0000 [IU] | Freq: Once | INTRAVENOUS | Status: AC
  Administered 2015-04-04: 3000 [IU] via INTRAVENOUS
  Filled 2015-04-04: qty 3000

## 2015-04-04 MED ORDER — WARFARIN SODIUM 10 MG PO TABS
10.0000 mg | ORAL_TABLET | Freq: Once | ORAL | Status: AC
  Administered 2015-04-04: 10 mg via ORAL
  Filled 2015-04-04: qty 1

## 2015-04-04 MED ORDER — SODIUM CHLORIDE 0.9 % IV SOLN
INTRAVENOUS | Status: AC
  Administered 2015-04-04: 01:00:00 via INTRAVENOUS

## 2015-04-04 MED ORDER — HYDROCODONE-ACETAMINOPHEN 5-325 MG PO TABS
1.0000 | ORAL_TABLET | ORAL | Status: DC | PRN
  Administered 2015-04-04: 1 via ORAL
  Filled 2015-04-04: qty 1

## 2015-04-04 MED ORDER — HEPARIN (PORCINE) IN NACL 100-0.45 UNIT/ML-% IJ SOLN
1500.0000 [IU]/h | INTRAMUSCULAR | Status: DC
  Administered 2015-04-04: 1500 [IU]/h via INTRAVENOUS
  Filled 2015-04-04: qty 250

## 2015-04-04 MED ORDER — WARFARIN - PHARMACIST DOSING INPATIENT: Freq: Every day | Status: DC

## 2015-04-04 MED ORDER — RIVAROXABAN (XARELTO) EDUCATION KIT FOR DVT/PE PATIENTS
PACK | Freq: Once | Status: AC
  Administered 2015-04-04: 12:00:00
  Filled 2015-04-04: qty 1

## 2015-04-04 MED ORDER — WARFARIN VIDEO: Freq: Once | Status: DC

## 2015-04-04 MED ORDER — PNEUMOCOCCAL VAC POLYVALENT 25 MCG/0.5ML IJ INJ: 0.5000 mL | INJECTION | INTRAMUSCULAR | Status: DC

## 2015-04-04 MED ORDER — RIVAROXABAN 15 MG PO TABS
15.0000 mg | ORAL_TABLET | Freq: Two times a day (BID) | ORAL | Status: DC
  Administered 2015-04-04: 15 mg via ORAL
  Filled 2015-04-04 (×2): qty 1

## 2015-04-04 MED ORDER — MORPHINE SULFATE 2 MG/ML IJ SOLN
2.0000 mg | INTRAMUSCULAR | Status: DC | PRN
  Administered 2015-04-04: 2 mg via INTRAVENOUS
  Filled 2015-04-04: qty 1

## 2015-04-04 MED ORDER — COUMADIN BOOK
1.0000 | Freq: Once | Status: DC
  Filled 2015-04-04: qty 1

## 2015-04-04 MED ORDER — ONDANSETRON HCL 4 MG/2ML IJ SOLN: 4.0000 mg | Freq: Four times a day (QID) | INTRAMUSCULAR | Status: DC | PRN

## 2015-04-04 MED ORDER — ACETAMINOPHEN 325 MG PO TABS: 650.0000 mg | ORAL_TABLET | Freq: Four times a day (QID) | ORAL | Status: DC | PRN

## 2015-04-04 MED ORDER — FERROUS SULFATE 325 (65 FE) MG PO TABS
325.0000 mg | ORAL_TABLET | Freq: Every day | ORAL | Status: DC
  Administered 2015-04-04: 325 mg via ORAL
  Filled 2015-04-04: qty 1

## 2015-04-04 MED ORDER — MORPHINE SULFATE 2 MG/ML IJ SOLN: 2.0000 mg | INTRAMUSCULAR | Status: DC | PRN

## 2015-04-04 MED ORDER — ACETAMINOPHEN 650 MG RE SUPP: 650.0000 mg | Freq: Four times a day (QID) | RECTAL | Status: DC | PRN

## 2015-04-04 MED ORDER — RIVAROXABAN (XARELTO) VTE STARTER PACK (15 & 20 MG): ORAL_TABLET | ORAL | Status: DC

## 2015-04-04 MED ORDER — SODIUM CHLORIDE 0.9 % IJ SOLN
3.0000 mL | Freq: Two times a day (BID) | INTRAMUSCULAR | Status: DC
  Administered 2015-04-04: 3 mL via INTRAVENOUS

## 2015-04-04 MED ORDER — ONDANSETRON HCL 4 MG PO TABS: 4.0000 mg | ORAL_TABLET | Freq: Four times a day (QID) | ORAL | Status: DC | PRN

## 2015-04-04 NOTE — Progress Notes (Addendum)
ANTICOAGULATION CONSULT NOTE - Follow Up Consult  Pharmacy Consult for Heparin Indication: pulmonary embolus  No Known Allergies  Patient Measurements: Height: 5\' 6"  (167.6 cm) Weight: 220 lb 1.6 oz (99.837 kg) IBW/kg (Calculated) : 59.3 Heparin Dosing Weight:   Vital Signs: Temp: 98.6 F (37 C) (05/15 0213) Temp Source: Oral (05/15 0213) BP: 114/65 mmHg (05/15 0213) Pulse Rate: 72 (05/15 0213)  Labs:  Recent Labs  04/03/15 2204 04/03/15 2210  HGB 9.8* 12.2  HCT 30.7* 36.0  PLT 374  --   LABPROT 13.5  --   INR 1.02  --   CREATININE  --  0.80    Estimated Creatinine Clearance: 115.9 mL/min (by C-G formula based on Cr of 0.8).   Medications:  Infusions:  . sodium chloride 100 mL/hr at 04/04/15 0046  . heparin 1,500 Units/hr (04/04/15 0109)    Assessment: Patient with PE.  No oral anticoagulants noted on med rec. Baseline anticoags WNL. Pharmacy to dose heparin and warfarin  Goal of Therapy:  Heparin level 0.3-0.7 units/ml Monitor platelets by anticoagulation protocol: Yes  INR 2-3   Plan:  Heparin bolus 3000 units iv x1 Heparin drip at 1500  units/hr Daily CBC Next heparin level at 0900 Start with Coumadin 10 mg at 0800. Check PT/INR daily. Provide Coumadin education.    Darlina GuysGrimsley Jr, Jacquenette ShoneJulian Crowford 04/04/2015,2:28 AM

## 2015-04-04 NOTE — Progress Notes (Signed)
  Echocardiogram 2D Echocardiogram has been performed.  Aris EvertsRix, Prestina Raigoza A 04/04/2015, 12:12 PM

## 2015-04-04 NOTE — Discharge Instructions (Signed)
Information on my medicine - XARELTO (rivaroxaban)  This medication education was reviewed with me or my healthcare representative as part of my discharge preparation.  The pharmacist that spoke with me during my hospital stay was:  Clance BollRunyon, Aitanna Haubner, [facility]  WHY WAS XARELTO PRESCRIBED FOR YOU? Xarelto was prescribed to treat blood clots that may have been found in the veins of your legs (deep vein thrombosis) or in your lungs (pulmonary embolism) and to reduce the risk of them occurring again.  What do you need to know about Xarelto? The starting dose is one 15 mg tablet taken TWICE daily with food for the FIRST 21 DAYS then on (enter date)  04/25/2015  the dose is changed to one 20 mg tablet taken ONCE A DAY with your evening meal.  DO NOT stop taking Xarelto without talking to the health care provider who prescribed the medication.  Refill your prescription for 20 mg tablets before you run out.  After discharge, you should have regular check-up appointments with your healthcare provider that is prescribing your Xarelto.  In the future your dose may need to be changed if your kidney function changes by a significant amount.  What do you do if you miss a dose? If you are taking Xarelto TWICE DAILY and you miss a dose, take it as soon as you remember. You may take two 15 mg tablets (total 30 mg) at the same time then resume your regularly scheduled 15 mg twice daily the next day.  If you are taking Xarelto ONCE DAILY and you miss a dose, take it as soon as you remember on the same day then continue your regularly scheduled once daily regimen the next day. Do not take two doses of Xarelto at the same time.   Important Safety Information Xarelto is a blood thinner medicine that can cause bleeding. You should call your healthcare provider right away if you experience any of the following: ? Bleeding from an injury or your nose that does not stop. ? Unusual colored urine (red or dark brown) or  unusual colored stools (red or black). ? Unusual bruising for unknown reasons. ? A serious fall or if you hit your head (even if there is no bleeding).  Some medicines may interact with Xarelto and might increase your risk of bleeding while on Xarelto. To help avoid this, consult your healthcare provider or pharmacist prior to using any new prescription or non-prescription medications, including herbals, vitamins, non-steroidal anti-inflammatory drugs (NSAIDs) and supplements.  This website has more information on Xarelto: [email or web address].

## 2015-04-04 NOTE — Progress Notes (Addendum)
ANTICOAGULATION CONSULT NOTE - Initial Consult  Pharmacy Consult for Xarelto  Indication: pulmonary embolus  No Known Allergies  Patient Measurements: Height: 5\' 6"  (167.6 cm) Weight: 220 lb 1.6 oz (99.837 kg) IBW/kg (Calculated) : 59.3   Vital Signs: Temp: 98.1 F (36.7 C) (05/15 0538) Temp Source: Oral (05/15 0538) BP: 115/68 mmHg (05/15 0538) Pulse Rate: 70 (05/15 0538)  Labs:  Recent Labs  04/03/15 2204 04/03/15 2210 04/04/15 0139 04/04/15 0626  HGB 9.8* 12.2  --  9.2*  HCT 30.7* 36.0  --  29.7*  PLT 374  --   --  352  LABPROT 13.5  --   --   --   INR 1.02  --   --   --   CREATININE  --  0.80  --  0.70  TROPONINI  --   --  <0.03 <0.03    Estimated Creatinine Clearance: 115.9 mL/min (by C-G formula based on Cr of 0.7).   Medical History: Past Medical History  Diagnosis Date  . Hypertension   . Anemia   . Syncope   . Obesity   . Menorrhagia     Assessment: 45 yoF admitted for PE.  Pharmacy initially consulted to begin warfarin and heparin infusion.  Both were started early this AM.  Now switching to Xarelto for treatment of PE.  Hgb 9.2, platelets WNL.  Renal function WNL.  CrCl>100 ml/min.  Goal of Therapy:  Treatment of acute PE   Plan:  - Xarelto 15 mg BID with meals x 21 days then on 6/5 start 20 mg daily.   - Stop heparin drip and give first dose of Xarelto now. - Completed education with patient and provided patient with Xarelto education kit with discount card (provides first month of Xarelto free).  Hershal Coria 04/04/2015,8:38 AM

## 2015-04-04 NOTE — Progress Notes (Signed)
*  PRELIMINARY RESULTS* Vascular Ultrasound Lower extremity venous duplex has been completed.  Preliminary findings: no evidence of DVT  Farrel DemarkJill Eunice, RDMS, RVT  04/04/2015, 8:21 AM

## 2015-04-04 NOTE — Discharge Summary (Signed)
Physician Discharge Summary  Melissa LenzJosephine Simcoe ZOX:[id number] DOB: 11/11/1976 DOA: 04/03/2015  PCP: Kevin FentonBradshaw, Samuel, MD  Admit date: 04/03/2015 Discharge date: 04/04/2015  Recommendations for Outpatient Follow-up:  Please note that you have a new diagnosis of pulmonary embolism (blood clot in the lungs).  You will be prescribed xarelto starter pack on discharge. After you complete this please make sure you follow up with PCP or [facility] clinic for refills on xarelto and to make sure no bleeding and that you are safely tolerating this medication. It is important to continue xarelto and please do not stop on your own unless you are experiencing the bleeding.  Discharge Diagnoses:  Active Problems:   Iron deficiency anemia   HYPERTENSION, BENIGN SYSTEMIC   Pulmonary embolus   Hyponatremia   Pulmonary embolism   Chest pain   Hypokalemia    Discharge Condition: stable   Diet recommendation: as tolerated   History of present illness:  39 y.o. female with past medical history of hypertension, iron deficiency anemia [facility] concern for chest pain in the middle of the chest area that started 24 hours prior to the admission. Pain was sharp, intermittent, worse with breathing, 10/10 in intensity, not relieved with rest. Pain is better with analgesia given in hospital. No previous history of blood clots and no family history of blood clots.  Pt was found to have tiny pulmonary embolism on CT angio chest with possibly mild pulmonary infarct in left lower lobe.  She was started on heparin drip for anticoagulation. After discussion with the pt [facility]. I spoke with the pt and her daughter about need to follow up with PCP in 1 week to make sure she is not experiencing any bleeding. [facility]   Hospital Course:   Active Problems: Chest pain / acute pulmonary embolism - Chest pain  likely from new pulmonary embolism - Troponin level WNL - The 12 lead EKG showed sinus rhythm - Initially on heparin drip but will prescribe xarelto on discharge per pt preference - Pt will follow up with PCP (Since it is Sunday pt will sch appt for herself in about 1 week from discharge)    Iron deficiency anemia - Continue ferrous sulfate supplementation    Leukocytosis - Likely reactive from pulmonary embolism     HYPERTENSION, BENIGN SYSTEMIC - Continue Prinizide    Hypokalemia - Pt on lisinopril-hctz which could contribute to hypokalemia - Supplemented and now WNL    Signed:  Manson PasseyEVINE, Shoua Ulloa, MD  [facility] 04/04/2015, 10:04 AM  Pager #: [phone]  Time spent in minutes: less than 30 minutes  Procedures:  None   Consultations:  None   Discharge Exam: Filed Vitals:   04/04/15 0538  BP: 115/68  Pulse: 70  Temp: 98.1 F (36.7 C)  Resp: 18   Filed Vitals:   04/04/15 0007 04/04/15 0017 04/04/15 0213 04/04/15 0538  BP: 115/79 133/91 114/65 115/68  Pulse: 75 72 72 70  Temp: 97.8 F (36.6 C) 98.7 F (37.1 C) 98.6 F (37 C) 98.1 F (36.7 C)  TempSrc: Oral Oral Oral Oral  Resp: 18  18 18   Height:  5\' 6"  (1.676 m)    Weight:  99.837 kg (220 lb 1.6 oz)    SpO2: 100% 100% 100% 100%    General: Pt is alert, follows commands appropriately, not in acute distress Cardiovascular: Regular rate and rhythm,  S1/S2 +, no murmurs Respiratory: Clear to auscultation bilaterally, no wheezing, no crackles, no rhonchi Abdominal: Soft, non tender, non distended, bowel sounds +, no guarding Extremities: no edema, no cyanosis, pulses palpable bilaterally DP and PT Neuro: Grossly nonfocal  Discharge Instructions  Discharge Instructions    Call MD for:  difficulty breathing, headache or visual disturbances    Complete by:  As directed      Call MD for:  persistant nausea and vomiting    Complete by:  As directed      Call MD for:  severe uncontrolled pain     Complete by:  As directed      Diet - low sodium heart healthy    Complete by:  As directed      Discharge instructions    Complete by:  As directed   Please note that you have a new diagnosis of pulmonary embolism (blood clot in the lungs).  You will be prescribed xarelto starter pack on discharge. After you complete this please make sure you follow up with PCP or [facility] wellness clinic for refills on xarelto and to make sure no bleeding and that you are safely tolerating this medication. It is important to continue xarelto and please do not stop on your own unless you are experiencing the bleeding.     Increase activity slowly    Complete by:  As directed             Medication List    STOP taking these medications        naproxen 500 MG tablet  Commonly known as:  NAPROSYN      TAKE these medications        acetaminophen 325 MG tablet  Commonly known as:  TYLENOL  Take 2 tablets (650 mg total) by mouth every 6 (six) hours as needed for mild pain (or Fever >/= 101).     ferrous sulfate 325 (65 FE) MG tablet  Take 1 tablet (325 mg total) by mouth daily with breakfast.     ibuprofen 200 MG tablet  Commonly known as:  ADVIL,MOTRIN  Take 600 mg by mouth every 8 (eight) hours as needed (for pain.).     lisinopril-hydrochlorothiazide 20-25 MG per tablet  Commonly known as:  PRINZIDE,ZESTORETIC  Take 1 tablet by mouth daily.     Rivaroxaban 15 & 20 MG Tbpk  Commonly known as:  XARELTO STARTER PACK  Take as directed on package: Start with one 15mg  tablet by mouth twice a day with food. On Day 22, switch to one 20mg  tablet once a day with food.     VITAMIN B-6 PO  Take 1 tablet by mouth daily.     vitamin C 500 MG tablet  Commonly known as:  ASCORBIC ACID  Take 500 mg by mouth daily.           Follow-up Information    Follow up with Kevin FentonBradshaw, Samuel, MD. Schedule an appointment as soon as possible for a visit in 1 week.   Specialty:  Family Medicine    Why:  Follow up appt after recent hospitalization   Contact information:   [address] [place] [address] [phone]       Follow up with [facility]    . Schedule an appointment as soon as possible for a visit in 3 weeks.   Why:  Follow up appt after recent hospitalization   Contact information:   [address] [place] [address] [phone]       The results of significant diagnostics from this hospitalization (including imaging, microbiology, ancillary and laboratory) are listed below for reference.    Significant Diagnostic Studies: Ct Angio Chest Pe W/cm &/or Wo Cm  04/03/2015   CLINICAL DATA:  Acute onset of left-sided chest pain and squeezing sensation. Pain radiates to the back. Difficulty breathing. Initial encounter.  EXAM: CT ANGIOGRAPHY CHEST WITH CONTRAST  TECHNIQUE: Multidetector CT imaging of the chest was performed using the standard protocol during bolus administration of intravenous contrast. Multiplanar CT image reconstructions and MIPs were obtained to evaluate the vascular anatomy.  CONTRAST:  OMNIPAQUE IOHEXOL 350 MG/ML SOLN  COMPARISON:  Chest radiograph performed 09/15/2005  FINDINGS: There is suggestion of a tiny pulmonary embolus within a subsegmental branch to the left lower lobe. Patchy airspace opacity within the left lower lobe is suggestive of mild pulmonary infarct and atelectasis. A trace left pleural effusion is noted.  The remainder of the lungs are clear. There is no evidence of significant focal consolidation, pleural effusion or pneumothorax. No masses are identified; no abnormal focal contrast enhancement is seen.  The mediastinum is unremarkable in appearance. No mediastinal lymphadenopathy is seen. No pericardial effusion is identified. The great vessels are grossly unremarkable in appearance. No axillary lymphadenopathy is seen. The visualized portions of the thyroid gland are  unremarkable in appearance.  The visualized portions of the liver and spleen are unremarkable. The visualized portions of the pancreas, gallbladder, stomach, adrenal glands and kidneys are within normal limits. A large calcification is noted within the right breast, likely benign in nature.  No acute osseous abnormalities are seen.  Review of the MIP images confirms the above findings.  IMPRESSION: Suggestion of tiny pulmonary embolus within a subsegmental branch to the left lower lung lobe. Patchy airspace opacity within the left lower lobe is suggestive of mild pulmonary infarct and atelectasis. Trace left pleural effusion noted.  Critical Value/emergent results were called by telephone at the time of interpretation on 04/03/2015 at 11:15 pm to Dr. Tomasita Crumble, who verbally acknowledged these results.   Electronically Signed   By: Roanna Raider M.D.   On: 04/03/2015 23:16    Microbiology: No results found for this or any previous visit (from the past 240 hour(s)).   Labs: Basic Metabolic Panel:  Recent Labs Lab 04/03/15 2210 04/04/15 0626  NA 139 141  K 3.2* 3.8  CL 100* 107  CO2  --  25  GLUCOSE 95 93  BUN 7 7  CREATININE 0.80 0.70  CALCIUM  --  8.7*  MG  --  1.8  PHOS  --  3.6   Liver Function Tests:  Recent Labs Lab 04/04/15 0626  AST 16  ALT 9*  ALKPHOS 58  BILITOT 0.7  PROT 6.8  ALBUMIN 3.0*   No results for input(s): LIPASE, AMYLASE in the last 168 hours. No results for input(s): AMMONIA in the last 168 hours. CBC:  Recent Labs Lab 04/03/15 2204 04/03/15 2210 04/04/15 0626  WBC 13.3*  --  11.7*  NEUTROABS 8.8*  --   --   HGB 9.8* 12.2 9.2*  HCT 30.7* 36.0 29.7*  MCV 67.6*  --  68.6*  PLT 374  --  352   Cardiac Enzymes:  Recent Labs Lab 04/04/15 0139 04/04/15 0626  TROPONINI <0.03 <0.03   BNP: BNP (last 3 results) No results for input(s): BNP in the last 8760 hours.  ProBNP (last  3 results) No results for input(s): PROBNP in the last 8760  hours.  CBG: No results for input(s): GLUCAP in the last 168 hours.

## 2015-04-04 NOTE — Progress Notes (Signed)
Patient discharged home with family, discharge instructions given and explained to patient and she verbalized understanding, patient denies any pain/distress, accompanied home by family. Skin intact, no wound noted.

## 2015-04-05 LAB — PROTEIN C, TOTAL: Protein C, Total: 85 % (ref 70–140)

## 2015-04-06 LAB — HOMOCYSTEINE: Homocysteine: 8.2 umol/L (ref 0.0–15.0)

## 2015-04-07 LAB — CARDIOLIPIN ANTIBODIES, IGG, IGM, IGA
Anticardiolipin IgA: <9 APL U/mL (ref 0–11)
Anticardiolipin IgG: <9 GPL U/mL (ref 0–14)

## 2015-04-07 LAB — PROTEIN C ACTIVITY: Protein C Activity: 112 % (ref 74–151)

## 2015-04-07 LAB — LUPUS ANTICOAGULANT PANEL
DRVVT: 47.5 s (ref 0.0–55.1)
PTT LA: 43.7 s (ref 0.0–50.0)

## 2015-04-07 LAB — BETA-2-GLYCOPROTEIN I ABS, IGG/M/A: Beta-2-Glycoprotein I IgM: <9 GPI IgM units (ref 0–32)

## 2015-04-07 LAB — PROTEIN S ACTIVITY: PROTEIN S ACTIVITY: 83 % (ref 60–145)

## 2015-04-07 LAB — PROTEIN S, TOTAL: Protein S Ag, Total: 119 % (ref 58–150)

## 2015-04-08 LAB — PROTHROMBIN GENE MUTATION

## 2015-04-08 LAB — FACTOR 5 LEIDEN

## 2015-04-11 ENCOUNTER — Emergency Department (HOSPITAL_COMMUNITY): Payer: Medicaid Other

## 2015-04-11 ENCOUNTER — Emergency Department (HOSPITAL_COMMUNITY)
Admission: EM | Admit: 2015-04-11 | Discharge: 2015-04-11 | Disposition: A | Discharge status: Home / Self Care | Payer: Medicaid Other | Attending: Emergency Medicine | Admitting: Emergency Medicine

## 2015-04-11 ENCOUNTER — Encounter (HOSPITAL_COMMUNITY): Payer: Self-pay | Admitting: Emergency Medicine

## 2015-04-11 DIAGNOSIS — E669 Obesity, unspecified: Secondary | ICD-10-CM | POA: Diagnosis not present

## 2015-04-11 DIAGNOSIS — R0602 Shortness of breath: Secondary | ICD-10-CM | POA: Insufficient documentation

## 2015-04-11 DIAGNOSIS — Z79899 Other long term (current) drug therapy: Secondary | ICD-10-CM | POA: Insufficient documentation

## 2015-04-11 DIAGNOSIS — R109 Unspecified abdominal pain: Secondary | ICD-10-CM | POA: Diagnosis not present

## 2015-04-11 DIAGNOSIS — Z720 Tobacco use: Secondary | ICD-10-CM | POA: Insufficient documentation

## 2015-04-11 DIAGNOSIS — Z9851 Tubal ligation status: Secondary | ICD-10-CM | POA: Diagnosis not present

## 2015-04-11 DIAGNOSIS — Z3202 Encounter for pregnancy test, result negative: Secondary | ICD-10-CM | POA: Diagnosis not present

## 2015-04-11 DIAGNOSIS — Z8742 Personal history of other diseases of the female genital tract: Secondary | ICD-10-CM | POA: Diagnosis not present

## 2015-04-11 DIAGNOSIS — I10 Essential (primary) hypertension: Secondary | ICD-10-CM | POA: Diagnosis not present

## 2015-04-11 DIAGNOSIS — M549 Dorsalgia, unspecified: Secondary | ICD-10-CM | POA: Diagnosis present

## 2015-04-11 DIAGNOSIS — D649 Anemia, unspecified: Secondary | ICD-10-CM | POA: Diagnosis not present

## 2015-04-11 LAB — CBC WITH DIFFERENTIAL/PLATELET
BASOS PCT: 0 % (ref 0–1)
Basophils Absolute: 0 10*3/uL (ref 0.0–0.1)
Eosinophils Absolute: 0.1 10*3/uL (ref 0.0–0.7)
Eosinophils Relative: 1 % (ref 0–5)
HEMATOCRIT: 31.8 % — AB (ref 36.0–46.0)
Hemoglobin: 9.8 g/dL — ABNORMAL LOW (ref 12.0–15.0)
Lymphocytes Relative: 29 % (ref 12–46)
Lymphs Abs: 3.4 10*3/uL (ref 0.7–4.0)
MCH: 21.1 pg — AB (ref 26.0–34.0)
MCHC: 30.8 g/dL (ref 30.0–36.0)
MCV: 68.5 fL — ABNORMAL LOW (ref 78.0–100.0)
MONOS PCT: 8 % (ref 3–12)
Monocytes Absolute: 0.9 10*3/uL (ref 0.1–1.0)
NEUTROS PCT: 62 % (ref 43–77)
Neutro Abs: 7.2 10*3/uL (ref 1.7–7.7)
Platelets: 452 10*3/uL — ABNORMAL HIGH (ref 150–400)
RBC: 4.64 MIL/uL (ref 3.87–5.11)
RDW: 19.1 % — ABNORMAL HIGH (ref 11.5–15.5)
WBC: 11.6 10*3/uL — ABNORMAL HIGH (ref 4.0–10.5)

## 2015-04-11 LAB — I-STAT CHEM 8, ED
BUN: 3 mg/dL — AB (ref 6–20)
Calcium, Ion: 1.14 mmol/L (ref 1.12–1.23)
Chloride: 102 mmol/L (ref 101–111)
Creatinine, Ser: 0.7 mg/dL (ref 0.44–1.00)
Glucose, Bld: 102 mg/dL — ABNORMAL HIGH (ref 65–99)
HEMATOCRIT: 37 % (ref 36.0–46.0)
HEMOGLOBIN: 12.6 g/dL (ref 12.0–15.0)
POTASSIUM: 3.2 mmol/L — AB (ref 3.5–5.1)
Sodium: 142 mmol/L (ref 135–145)
TCO2: 21 mmol/L (ref 0–100)

## 2015-04-11 LAB — COMPREHENSIVE METABOLIC PANEL
ALT: 17 U/L (ref 14–54)
AST: 20 U/L (ref 15–41)
Albumin: 3.5 g/dL (ref 3.5–5.0)
Alkaline Phosphatase: 72 U/L (ref 38–126)
Anion gap: 8 (ref 5–15)
BUN: 6 mg/dL (ref 6–20)
CO2: 26 mmol/L (ref 22–32)
Calcium: 8.8 mg/dL — ABNORMAL LOW (ref 8.9–10.3)
Chloride: 104 mmol/L (ref 101–111)
Creatinine, Ser: 0.56 mg/dL (ref 0.44–1.00)
GFR calc Af Amer: >60 mL/min (ref >60)
GLUCOSE: 102 mg/dL — AB (ref 65–99)
Potassium: 3.3 mmol/L — ABNORMAL LOW (ref 3.5–5.1)
SODIUM: 138 mmol/L (ref 135–145)
Total Protein: 7.3 g/dL (ref 6.5–8.1)

## 2015-04-11 LAB — URINALYSIS, ROUTINE W REFLEX MICROSCOPIC
Bilirubin Urine: NEGATIVE
GLUCOSE, UA: NEGATIVE mg/dL
Hgb urine dipstick: NEGATIVE
Ketones, ur: NEGATIVE mg/dL
Leukocytes, UA: NEGATIVE
Nitrite: NEGATIVE
PROTEIN: NEGATIVE mg/dL
Specific Gravity, Urine: 1.007 (ref 1.005–1.030)
Urobilinogen, UA: 0.2 mg/dL (ref 0.0–1.0)
pH: 7 (ref 5.0–8.0)

## 2015-04-11 LAB — PROTIME-INR
INR: 1.95 — AB (ref 0.00–1.49)
Prothrombin Time: 22.1 seconds — ABNORMAL HIGH (ref 11.6–15.2)

## 2015-04-11 LAB — POC URINE PREG, ED: PREG TEST UR: NEGATIVE

## 2015-04-11 LAB — I-STAT CG4 LACTIC ACID, ED: Lactic Acid, Venous: 1.36 mmol/L (ref 0.5–2.0)

## 2015-04-11 LAB — APTT: aPTT: 38 seconds — ABNORMAL HIGH (ref 24–37)

## 2015-04-11 LAB — LIPASE, BLOOD: Lipase: 34 U/L (ref 22–51)

## 2015-04-11 MED ORDER — POTASSIUM CHLORIDE CRYS ER 20 MEQ PO TBCR
40.0000 meq | EXTENDED_RELEASE_TABLET | Freq: Once | ORAL | Status: AC
  Administered 2015-04-11: 40 meq via ORAL
  Filled 2015-04-11: qty 2

## 2015-04-11 MED ORDER — OXYCODONE HCL 5 MG PO TABS: 5.0000 mg | ORAL_TABLET | Freq: Three times a day (TID) | ORAL | Status: DC | PRN

## 2015-04-11 MED ORDER — HYDROMORPHONE HCL 1 MG/ML IJ SOLN
1.0000 mg | Freq: Once | INTRAMUSCULAR | Status: AC
  Administered 2015-04-11: 1 mg via INTRAVENOUS
  Filled 2015-04-11: qty 1

## 2015-04-11 MED ORDER — METOCLOPRAMIDE HCL 5 MG/ML IJ SOLN
10.0000 mg | Freq: Once | INTRAMUSCULAR | Status: AC
  Administered 2015-04-11: 10 mg via INTRAVENOUS
  Filled 2015-04-11: qty 2

## 2015-04-11 MED ORDER — MORPHINE SULFATE 4 MG/ML IJ SOLN
4.0000 mg | Freq: Once | INTRAMUSCULAR | Status: AC
  Administered 2015-04-11: 4 mg via INTRAVENOUS
  Filled 2015-04-11: qty 1

## 2015-04-11 MED ORDER — ONDANSETRON HCL 4 MG/2ML IJ SOLN
4.0000 mg | Freq: Once | INTRAMUSCULAR | Status: AC
  Administered 2015-04-11: 4 mg via INTRAVENOUS
  Filled 2015-04-11: qty 2

## 2015-04-11 MED ORDER — SODIUM CHLORIDE 0.9 % IV BOLUS (SEPSIS)
1000.0000 mL | Freq: Once | INTRAVENOUS | Status: AC
  Administered 2015-04-11: 1000 mL via INTRAVENOUS

## 2015-04-11 NOTE — Discharge Instructions (Signed)
Flank Pain Ms. Deleeuw, continue to take pain medicine as prescribed.  See your primary care physician within 3 days for close follow up.  If symptoms worsen, come back to the ED immediately.  Thank you. Flank pain is pain in your side. The flank is the area of your side between your upper belly (abdomen) and your back. Pain in this area can be caused by many different things. HOME CARE Home care and treatment will depend on the cause of your pain.  Rest as told by your doctor.  Drink enough fluids to keep your pee (urine) clear or pale yellow.  Only take medicine as told by your doctor.  Tell your doctor about any changes in your pain.  Follow up with your doctor. GET HELP RIGHT AWAY IF:   Your pain does not get better with medicine.   You have new symptoms or your symptoms get worse.  Your pain gets worse.   You have belly (abdominal) pain.   You are short of breath.   You always feel sick to your stomach (nauseous).   You keep throwing up (vomiting).   You have puffiness (swelling) in your belly.   You feel light-headed or you pass out (faint).   You have blood in your pee.  You have a fever or lasting symptoms for more than 2-3 days.  You have a fever and your symptoms suddenly get worse. MAKE SURE YOU:   Understand these instructions.  Will watch your condition.  Will get help right away if you are not doing well or get worse. Document Released: 08/15/2008 Document Revised: 03/23/2014 Document Reviewed: 06/20/2012 [facility] Patient Information 2015 [place]. This information is not intended to replace advice given to you by your health care provider. Make sure you discuss any questions you have with your health care provider.

## 2015-04-11 NOTE — ED Notes (Signed)
Patient transported to X-ray 

## 2015-04-11 NOTE — ED Notes (Signed)
Pt arrive to the ED with a complaint of left lower back pain.  Pt states she was seen a week ago and diagnosed with a pulmonary emboli which she was given an anticoagulant.  Pt states her back pain started Friday and has progressively gotten worse ove the course of the last two days.  Pt describes pain as constant sharp pain.

## 2015-04-11 NOTE — ED Provider Notes (Signed)
CSN: [id number]     Arrival date & time 04/11/15  0255 History   First MD Initiated Contact with Patient 04/11/15 [phone]     Chief Complaint  Patient presents with  . Back Pain     (Consider location/radiation/quality/duration/timing/severity/associated sxs/prior Treatment) HPI   Ms. Winton is a 39yo female, PMH of HTN, anemia, PE on xarelto, presenting to day with worsening back pain.  Patient states she was DC from the hospital 1 week ago with PE dx and has been compliant with xarelto.  Her chest pain has now moved to L flank pain.  It is severe and not improving with tylenol.  She denies any trauma or bleeding anywhere.  She has no CP or SOB.  She has no urinary symptoms or lower neurological symptoms.  She denies fevers or recent infections.  PAtient has no further complaints.  10 Systems reviewed and are negative for acute change except as noted in the HPI.   Past Medical History  Diagnosis Date  . Hypertension   . Anemia   . Syncope   . Obesity   . Menorrhagia    Past Surgical History  Procedure Laterality Date  . Tubal ligation    . Breast surgery     Family History  Problem Relation Age of Onset  . Hypertension Mother   . Hyperlipidemia Mother   . Diabetes Father   . Alcohol abuse Father   . Lupus Sister    History  Substance Use Topics  . Smoking status: Current Every Day Smoker -- 0.25 packs/day    Types: Cigarettes  . Smokeless tobacco: Never Used  . Alcohol Use: Yes     Comment: occasional   OB History    No data available     Review of Systems    Allergies  Review of patient's allergies indicates no known allergies.  Home Medications   Prior to Admission medications   Medication Sig Start Date End Date Taking? Authorizing Provider  acetaminophen (TYLENOL) 325 MG tablet Take 2 tablets (650 mg total) by mouth every 6 (six) hours as needed for mild pain (or Fever >/= 101). 04/04/15  Yes Alison Murray, MD  ferrous sulfate 325 (65 FE) MG tablet  Take 1 tablet (325 mg total) by mouth daily with breakfast. 05/29/14  Yes Elenora Gamma, MD  lisinopril-hydrochlorothiazide (PRINZIDE,ZESTORETIC) 20-25 MG per tablet Take 1 tablet by mouth daily. 05/25/14  Yes Elenora Gamma, MD  pyridOXINE (VITAMIN B-6) 100 MG tablet Take 100 mg by mouth daily.   Yes Historical Provider, MD  Rivaroxaban (XARELTO STARTER PACK) 15 & 20 MG TBPK Take as directed on package: Start with one  tablet by mouth twice a day with food. On Day 22, switch to one  tablet once a day with food. 04/04/15  Yes Alison Murray, MD  vitamin C (ASCORBIC ACID) 500 MG tablet Take 500 mg by mouth daily.   Yes Historical Provider, MD  ibuprofen (ADVIL,MOTRIN) 200 MG tablet Take 600 mg by mouth every 8 (eight) hours as needed for mild pain.     Historical Provider, MD  oxyCODONE (ROXICODONE) 5 MG immediate release tablet Take 1 tablet (5 mg total) by mouth 3 (three) times daily as needed for severe pain or breakthrough pain. 04/11/15   Tomasita Crumble, MD   BP 129/80 mmHg  Pulse 75  Temp(Src) 98.1 F (36.7 C) (Oral)  Resp 16  Ht  (1.676 m)  Wt 220 lb (99.791 kg)  BMI 35.53  kg/m2  SpO2 99%  LMP 04/03/2015 Physical Exam  Constitutional: She is oriented to person, place, and time. She appears well-developed and well-nourished. She appears distressed.  HENT:  Head: Normocephalic and atraumatic.  Nose: Nose normal.  Mouth/Throat: Oropharynx is clear and moist. No oropharyngeal exudate.  Eyes: Conjunctivae and EOM are normal. Pupils are equal, round, and reactive to light. No scleral icterus.  Neck: Normal range of motion. Neck supple. No JVD present. No tracheal deviation present. No thyromegaly present.  Cardiovascular: Normal rate, regular rhythm and normal heart sounds.  Exam reveals no gallop and no friction rub.   No murmur heard. Pulmonary/Chest: Effort normal and breath sounds normal. No respiratory distress. She has no wheezes. She exhibits no tenderness.  Abdominal:  Soft. Bowel sounds are normal. She exhibits no distension and no mass. There is no tenderness. There is no rebound and no guarding.  L CVA tenderness  Musculoskeletal: Normal range of motion. She exhibits no edema or tenderness.  Lymphadenopathy:    She has no cervical adenopathy.  Neurological: She is alert and oriented to person, place, and time. No cranial nerve deficit. She exhibits normal muscle tone.  Skin: Skin is warm and dry. No rash noted. She is not diaphoretic. No erythema. No pallor.  Nursing note and vitals reviewed.   ED Course  Procedures (including critical care time) Labs Review Labs Reviewed  CBC WITH DIFFERENTIAL/PLATELET - Abnormal; Notable for the following:    WBC 11.6 (*)    Hemoglobin 9.8 (*)    HCT 31.8 (*)    MCV 68.5 (*)    MCH 21.1 (*)    RDW 19.1 (*)    Platelets 452 (*)    All other components within normal limits  COMPREHENSIVE METABOLIC PANEL - Abnormal; Notable for the following:    Potassium 3.3 (*)    Glucose, Bld 102 (*)    Calcium 8.8 (*)    Total Bilirubin <0.1 (*)    All other components within normal limits  PROTIME-INR - Abnormal; Notable for the following:    Prothrombin Time 22.1 (*)    INR 1.95 (*)    All other components within normal limits  APTT - Abnormal; Notable for the following:    aPTT 38 (*)    All other components within normal limits  I-STAT CHEM 8, ED - Abnormal; Notable for the following:    Potassium 3.2 (*)    BUN 3 (*)    Glucose, Bld 102 (*)    All other components within normal limits  LIPASE, BLOOD  URINALYSIS, ROUTINE W REFLEX MICROSCOPIC  I-STAT CG4 LACTIC ACID, ED  POC URINE PREG, ED    Imaging Review Ct Abdomen Pelvis Wo Contrast  04/11/2015   CLINICAL DATA:  Left lower back pain for 2 days.  Right flank pain.  EXAM: CT ABDOMEN AND PELVIS WITHOUT CONTRAST  TECHNIQUE: Multidetector CT imaging of the abdomen and pelvis was performed following the standard protocol without IV contrast.  COMPARISON:   Included portion CTA chest 04/03/2015  FINDINGS: Diminished atelectasis in the left lower lobe. Lung bases are currently clear.  The kidneys are symmetric in size without stones or hydronephrosis. There is no perinephric stranding. Both ureters are decompressed without stones along the course.  Evaluation of the remaining solid and hollow viscera is limited given lack of contrast. Small calcified gallstone within physiologically distended gallbladder. No biliary dilatation. The unenhanced liver, spleen, pancreas and adrenal glands are normal.  The stomach is physiologically distended. There  are no dilated or thickened bowel loops. Small to moderate volume of colonic stool, no colonic wall thickening. A single colonic diverticula is seen in the sigmoid colon. The appendix is normal. No free air, free fluid, or intra-abdominal fluid collection.  No retroperitoneal adenopathy. Abdominal aorta is normal in caliber. There is a small fat containing umbilical hernia.  Within the pelvis the urinary bladder is physiologically distended without stone. The uterus is normal. Bilateral tubal ligation clips are seen. The ovaries are symmetric. No pelvic free fluid.  There are no acute or suspicious osseous abnormalities.  IMPRESSION: 1.  No renal stones or obstructive uropathy. 2. Cholelithiasis without CT findings of acute cholecystitis. 3. Single colonic diverticula.  No diverticulitis.   Electronically Signed   By: Rubye OaksMelanie  Ehinger M.D.   On: 04/11/2015 05:53   Dg Chest 2 View  04/11/2015   CLINICAL DATA:  Shortness of breath.  Left lower back pain.  EXAM: CHEST  2 VIEW  COMPARISON:  Chest CT 04/03/2015  FINDINGS: Minimal atelectasis in the lingula and left lower lobe. The cardiomediastinal contours are normal. Pulmonary vasculature is normal. No consolidation, pleural effusion, or pneumothorax. No acute osseous abnormalities are seen.  IMPRESSION: Minimal atelectasis in the lingula and left lower lobe. This appears  improved compared to prior exam allowing for differences in modality.   Electronically Signed   By: Rubye OaksMelanie  Ehinger M.D.   On: 04/11/2015 04:29     EKG Interpretation   Date/Time:  Sunday Apr 11 2015 04:33:48 EDT Ventricular Rate:  89 PR Interval:  174 QRS Duration: 95 QT Interval:  431 QTC Calculation: 524 R Axis:   73 Text Interpretation:  Sinus rhythm Prolonged QT interval Confirmed by Erroll Lunani,  Lilyann Gravelle Ayokunle [phone]) on 04/11/2015 4:49:29 AM      MDM   Final diagnoses:  SOB (shortness of breath)  Right flank pain   Patient presents to the ED for L flank pain.  I am unsure what is causing her pain.  She was given morphine for pain control.  CT scan of abdomen was ordered to assess aorta, look for RP bleeding and to look for nephrolithiasis.  She was subsequently redosed with dilaudid and her pain was improved.  CT scan was negative for an acute cause of her flank pain.  UA does not show an infection.  Neurological exam is completely normal.  Strict return precautions were given and PCP fu was advised within 3 days.  Patient understood these recs.  She was given oxycodone to take at home for pain control.  Her VS remain within her normal limits and she is safe for DC.    Tomasita CrumbleAdeleke Joanann Mies, MD 04/11/15 2059

## 2015-04-11 NOTE — ED Notes (Signed)
Pt ambulating independently w/ steady gait on d/c in no acute distress, A&Ox4. D/c instructions reviewed w/ pt and family - pt and family deny any further questions or concerns at present. Rx given x1  

## 2015-04-12 ENCOUNTER — Emergency Department (HOSPITAL_COMMUNITY)
Admission: EM | Admit: 2015-04-12 | Discharge: 2015-04-12 | Disposition: A | Discharge status: Home / Self Care | Payer: Medicaid Other | Attending: Emergency Medicine | Admitting: Emergency Medicine

## 2015-04-12 ENCOUNTER — Encounter (HOSPITAL_COMMUNITY): Payer: Self-pay | Admitting: Emergency Medicine

## 2015-04-12 DIAGNOSIS — Z86711 Personal history of pulmonary embolism: Secondary | ICD-10-CM

## 2015-04-12 DIAGNOSIS — E669 Obesity, unspecified: Secondary | ICD-10-CM | POA: Diagnosis not present

## 2015-04-12 DIAGNOSIS — Z79899 Other long term (current) drug therapy: Secondary | ICD-10-CM | POA: Diagnosis not present

## 2015-04-12 DIAGNOSIS — D649 Anemia, unspecified: Secondary | ICD-10-CM | POA: Insufficient documentation

## 2015-04-12 DIAGNOSIS — R200 Anesthesia of skin: Secondary | ICD-10-CM | POA: Insufficient documentation

## 2015-04-12 DIAGNOSIS — Z9851 Tubal ligation status: Secondary | ICD-10-CM | POA: Diagnosis not present

## 2015-04-12 DIAGNOSIS — Z7901 Long term (current) use of anticoagulants: Secondary | ICD-10-CM | POA: Insufficient documentation

## 2015-04-12 DIAGNOSIS — Z8742 Personal history of other diseases of the female genital tract: Secondary | ICD-10-CM | POA: Diagnosis not present

## 2015-04-12 DIAGNOSIS — R109 Unspecified abdominal pain: Secondary | ICD-10-CM

## 2015-04-12 DIAGNOSIS — I10 Essential (primary) hypertension: Secondary | ICD-10-CM | POA: Insufficient documentation

## 2015-04-12 DIAGNOSIS — M549 Dorsalgia, unspecified: Secondary | ICD-10-CM | POA: Diagnosis present

## 2015-04-12 DIAGNOSIS — Z720 Tobacco use: Secondary | ICD-10-CM | POA: Diagnosis not present

## 2015-04-12 MED ORDER — MORPHINE SULFATE 4 MG/ML IJ SOLN
4.0000 mg | Freq: Once | INTRAMUSCULAR | Status: AC
  Administered 2015-04-12: 4 mg via INTRAMUSCULAR
  Filled 2015-04-12: qty 1

## 2015-04-12 NOTE — ED Notes (Signed)
Pt c/o 10/10 back pain onset several days ago, seen yesterday for the same. Pt states pain improved in ED, but worsened at home. Pt ambulatory independently with minimal difficulty, denies loss of control of bowel or bladder.

## 2015-04-12 NOTE — ED Provider Notes (Signed)
CSN: [id number]     Arrival date & time 04/12/15  1744 History   First MD Initiated Contact with Patient 04/12/15 1804     This chart was scribed for non-physician practitioner, Oswaldo ConroyVictoria Josette Shimabukuro PA-C working with Blake DivineJohn Wofford, MD by Arlan OrganAshley Leger, ED Scribe. This patient was seen in room [facility] and the patient's care was started at 7:23 PM.   Chief Complaint  Patient presents with  . Back Pain   The history is provided by the patient. No language interpreter was used.    HPI Comments: Melissa Krueger is a 39 y.o. female with a PMHx of HTN who presents to the Emergency Department complaining of constant, ongoing, progressively worsening back pain x  2-3 days. Currently she rates pain 10/10. Pt was evaluated here in ED yesterday for same and was discharged home with a prescription for Oxycodone. At this time pt also reports SOB when she attempts to sit up or ambulate. This is unchanged from yesterday. Prescribed Oxycodone attempted prior to arrival without significant improvement for symptoms. No recent fever, chills, abdominal pain, or chest pain. No bowel or urinary incontinence. She denies any saddle anesthesia. Pt was hospitalized 1 week ago for PE and started on Xarelto at time of discharge. She denies any missed dosages and is taking this medication as prescribed and directed. No known allergies to medications. Pt denies hemoptysis,  unilateral leg swelling or tenderness.   Past Medical History  Diagnosis Date  . Hypertension   . Anemia   . Syncope   . Obesity   . Menorrhagia    Past Surgical History  Procedure Laterality Date  . Tubal ligation    . Breast surgery     Family History  Problem Relation Age of Onset  . Hypertension Mother   . Hyperlipidemia Mother   . Diabetes Father   . Alcohol abuse Father   . Lupus Sister    History  Substance Use Topics  . Smoking status: Current Every Day Smoker -- 0.25 packs/day    Types: Cigarettes  . Smokeless tobacco: Never  Used  . Alcohol Use: Yes     Comment: occasional   OB History    No data available     Review of Systems  Constitutional: Negative for fever and chills.  Respiratory: Negative for shortness of breath.   Cardiovascular: Negative for chest pain.  Musculoskeletal: Positive for back pain.  Neurological: Positive for numbness. Negative for weakness.      Allergies  Review of patient's allergies indicates no known allergies.  Home Medications   Prior to Admission medications   Medication Sig Start Date End Date Taking? Authorizing Provider  acetaminophen (TYLENOL) 325 MG tablet Take 2 tablets (650 mg total) by mouth every 6 (six) hours as needed for mild pain (or Fever >/= 101). 04/04/15  Yes Alison MurrayAlma M Devine, MD  ferrous sulfate 325 (65 FE) MG tablet Take 1 tablet (325 mg total) by mouth daily with breakfast. 05/29/14  Yes Elenora GammaSamuel L Bradshaw, MD  lisinopril-hydrochlorothiazide (PRINZIDE,ZESTORETIC) 20-25 MG per tablet Take 1 tablet by mouth daily. 05/25/14  Yes Elenora GammaSamuel L Bradshaw, MD  oxyCODONE (ROXICODONE) 5 MG immediate release tablet Take 1 tablet (5 mg total) by mouth 3 (three) times daily as needed for severe pain or breakthrough pain. 04/11/15  Yes Tomasita CrumbleAdeleke Oni, MD  pyridOXINE (VITAMIN B-6) 100 MG tablet Take 100 mg by mouth daily.   Yes Historical Provider, MD  Rivaroxaban (XARELTO STARTER PACK) 15 & 20 MG TBPK Take as  directed on package: Start with one  tablet by mouth twice a day with food. On Day 22, switch to one  tablet once a day with food. 04/04/15  Yes Alison Murray, MD  vitamin C (ASCORBIC ACID) 500 MG tablet Take 500 mg by mouth daily.   Yes Historical Provider, MD  ibuprofen (ADVIL,MOTRIN) 200 MG tablet Take 600 mg by mouth every 8 (eight) hours as needed for mild pain.     Historical Provider, MD   Triage Vitals: BP 155/92 mmHg  Pulse 84  Temp(Src) 98.7 F (37.1 C) (Oral)  Resp 18  SpO2 100%  LMP 04/03/2015   Physical Exam  Constitutional: She appears  well-developed and well-nourished. No distress.  HENT:  Head: Normocephalic and atraumatic.  Eyes: Conjunctivae are normal. Right eye exhibits no discharge. Left eye exhibits no discharge.  Cardiovascular: Normal rate, regular rhythm and normal heart sounds.   No leg swelling or tenderness. Negative Homan's sign.  Pulmonary/Chest: Effort normal and breath sounds normal. No respiratory distress. She has no wheezes.  Abdominal: Soft. Bowel sounds are normal. She exhibits no distension. There is no tenderness.  Musculoskeletal: She exhibits tenderness.  No midline back tenderness, step off or crepitus. L sided CVA tenderness.   Neurological: She is alert. Coordination normal.  Equal muscle tone. 5/5 strength in lower extremities. DTR equal and intact. Negative straight leg test. Antalgic gait.  Skin: Skin is warm and dry. She is not diaphoretic.  Nursing note and vitals reviewed.   ED Course  Procedures (including critical care time)  DIAGNOSTIC STUDIES: Oxygen Saturation is 100% on RA, Normal by my interpretation.    COORDINATION OF CARE: 7:32 PM- Will give Morphine here in ED. Discussed treatment plan with pt at bedside and pt agreed to plan.     Labs Review Labs Reviewed - No data to display  Imaging Review Ct Abdomen Pelvis Wo Contrast  04/11/2015   CLINICAL DATA:  Left lower back pain for 2 days.  Right flank pain.  EXAM: CT ABDOMEN AND PELVIS WITHOUT CONTRAST  TECHNIQUE: Multidetector CT imaging of the abdomen and pelvis was performed following the standard protocol without IV contrast.  COMPARISON:  Included portion CTA chest 04/03/2015  FINDINGS: Diminished atelectasis in the left lower lobe. Lung bases are currently clear.  The kidneys are symmetric in size without stones or hydronephrosis. There is no perinephric stranding. Both ureters are decompressed without stones along the course.  Evaluation of the remaining solid and hollow viscera is limited given lack of contrast. Small  calcified gallstone within physiologically distended gallbladder. No biliary dilatation. The unenhanced liver, spleen, pancreas and adrenal glands are normal.  The stomach is physiologically distended. There are no dilated or thickened bowel loops. Small to moderate volume of colonic stool, no colonic wall thickening. A single colonic diverticula is seen in the sigmoid colon. The appendix is normal. No free air, free fluid, or intra-abdominal fluid collection.  No retroperitoneal adenopathy. Abdominal aorta is normal in caliber. There is a small fat containing umbilical hernia.  Within the pelvis the urinary bladder is physiologically distended without stone. The uterus is normal. Bilateral tubal ligation clips are seen. The ovaries are symmetric. No pelvic free fluid.  There are no acute or suspicious osseous abnormalities.  IMPRESSION: 1.  No renal stones or obstructive uropathy. 2. Cholelithiasis without CT findings of acute cholecystitis. 3. Single colonic diverticula.  No diverticulitis.   Electronically Signed   By: Rubye Oaks M.D.   On: 04/11/2015 05:53  Dg Chest 2 View  04/11/2015   CLINICAL DATA:  Shortness of breath.  Left lower back pain.  EXAM: CHEST  2 VIEW  COMPARISON:  Chest CT 04/03/2015  FINDINGS: Minimal atelectasis in the lingula and left lower lobe. The cardiomediastinal contours are normal. Pulmonary vasculature is normal. No consolidation, pleural effusion, or pneumothorax. No acute osseous abnormalities are seen.  IMPRESSION: Minimal atelectasis in the lingula and left lower lobe. This appears improved compared to prior exam allowing for differences in modality.   Electronically Signed   By: Rubye Oaks M.D.   On: 04/11/2015 04:29     EKG Interpretation None      ED ECG REPORT   Date: 04/12/2015  Rate: 86   Rhythm: normal sinus rhythm  QRS Axis: normal  Intervals: normal  ST/T Wave abnormalities: normal  Conduction Disutrbances:none  Narrative Interpretation:  Sinus rhythm, probable left atrial enlargement    MDM   Final diagnoses:  Left flank pain  History of pulmonary embolism   Pt with history of PE presenting with L flank pain. Pt reports compliance with her xarelto. Pt presented yesterday with similar symptoms with unremarkable workup including CXR, CT abdomen pelvis. Pt pain managed in the ED. Unclear etiology of pain. Likely related to PE. Patient is afebrile, nontoxic, and in no acute distress. Patient is appropriate for outpatient management and is stable for discharge. Pt with PCP appointment in 4-5 days.   Discussed return precautions with patient. Discussed all results and patient verbalizes understanding and agrees with plan.  Case has been discussed with Dr. Loretha Stapler who agrees with the above plan and to discharge.   I personally performed the services described in this documentation, which was scribed in my presence. The recorded information has been reviewed and is accurate.   Oswaldo Conroy, PA-C 04/13/15 1540  Blake Divine, MD 04/16/15 2029

## 2015-04-12 NOTE — Discharge Instructions (Signed)
Return to the emergency room with worsening of symptoms, new symptoms or with symptoms that are concerning, especially chest pain, worsening or change in shortness of breath, coughing up blood, difficulty breathing, fevers, chills, nausea, vomiting, abdominal pain. Continue take your Xarelto as scheduled. Follow up with your primary care provider as scheduled on Friday. Continue to take oxycodone for your pain. No drinking, driving, operating machinery or other sedating meds while taking pain medications. Please call your doctor for a followup appointment within 24-48 hours. When you talk to your doctor please let them know that you were seen in the emergency department and have them acquire all of your records so that they can discuss the findings with you and formulate a treatment plan to fully care for your new and ongoing problems.  Read below information and follow recommendations. Pulmonary Embolism A pulmonary (lung) embolism (PE) is a blood clot that has traveled to the lung and results in a blockage of blood flow in the affected lung. Most clots come from deep veins in the legs or pelvis. PE is a dangerous and potentially life-threatening condition that can be treated if identified. CAUSES Blood clots form in a vein for different reasons. Usually several things cause blood clots. They include:  The flow of blood slows down.  The inside of the vein is damaged in some way.  The person has a condition that makes the blood clot more easily. RISK FACTORS Some people are more likely than others to develop PE. Risk factors include:   Smoking.  Being overweight (obese).  Sitting or lying still for a long time. This includes long-distance travel, paralysis, or recovery from an illness or surgery. Other factors that increase risk are:   Older age, especially over 28 years of age.  Having a family history of blood clots or if you have already had a blood clot.  Having major or lengthy  surgery. This is especially true for surgery on the hip, knee, or belly (abdomen). Hip surgery is particularly high risk.  Having a long, thin tube (catheter) placed inside a vein during a medical procedure.  Breaking a hip or leg.  Having cancer or cancer treatment.  Medicines containing the female hormone estrogen. This includes birth control pills and hormone replacement therapy.  Other circulation or heart problems.  Pregnancy and childbirth.  Hormone changes make the blood clot more easily during pregnancy.  The fetus puts pressure on the veins of the pelvis.  There is a risk of injury to veins during delivery or a caesarean delivery. The risk is highest just after childbirth.  PREVENTION   Exercise the legs regularly. Take a brisk 30 minute walk every day.  Maintain a weight that is appropriate for your height.  Avoid sitting or lying in bed for long periods of time without moving your legs.  Women, particularly those over the age of 35 years, should consider the risks and benefits of taking estrogen medicines, including birth control pills.  Do not smoke, especially if you take estrogen medicines.  Long-distance travel can increase your risk. You should exercise your legs by walking or pumping the muscles every hour.  Many of the risk factors above relate to situations that exist with hospitalization, either for illness, injury, or elective surgery. Prevention may include medical and nonmedical measures.   Your health care provider will assess you for the need for venous thromboembolism prevention when you are admitted to the hospital. If you are having surgery, your surgeon will assess  you the day of or day after surgery.  SYMPTOMS  The symptoms of a PE usually start suddenly and include:  Shortness of breath.  Coughing.  Coughing up blood or blood-tinged mucus.  Chest pain. Pain is often worse with deep breaths.  Rapid heartbeat. DIAGNOSIS  If a PE is  suspected, your health care provider will take a medical history and perform a physical exam. Other tests that may be required include:  Blood tests, such as studies of the clotting properties of your blood.  Imaging tests, such as ultrasound, CT, MRI, and other tests to see if you have clots in your legs or lungs.  An electrocardiogram. This can look for heart strain from blood clots in the lungs. TREATMENT   The most common treatment for a PE is blood thinning (anticoagulant) medicine, which reduces the blood's tendency to clot. Anticoagulants can stop new blood clots from forming and old clots from growing. They cannot dissolve existing clots. Your body does this by itself over time. Anticoagulants can be given by mouth, through an intravenous (IV) tube, or by injection. Your health care provider will determine the best program for you.  Less commonly, clot-dissolving medicines (thrombolytics) are used to dissolve a PE. They carry a high risk of bleeding, so they are used mainly in severe cases.  Very rarely, a blood clot in the leg needs to be removed surgically.  If you are unable to take anticoagulants, your health care provider may arrange for you to have a filter placed in a main vein in your abdomen. This filter prevents clots from traveling to your lungs. HOME CARE INSTRUCTIONS   Take all medicines as directed by your health care provider.  Learn as much as you can about DVT.  Wear a medical alert bracelet or carry a medical alert card.  Ask your health care provider how soon you can go back to normal activities. It is important to stay active to prevent blood clots. If you are on anticoagulant medicine, avoid contact sports.  It is very important to exercise. This is especially important while traveling, sitting, or standing for long periods of time. Exercise your legs by walking or by tightening and relaxing your leg muscles regularly. Take frequent walks.  You may need to  wear compression stockings. These are tight elastic stockings that apply pressure to the lower legs. This pressure can help keep the blood in the legs from clotting. Taking Warfarin Warfarin is a daily medicine that is taken by mouth. Your health care provider will advise you on the length of treatment (usually 3-6 months, sometimes lifelong). If you take warfarin:  Understand how to take warfarin and foods that can affect how warfarin works in Public relations account executiveyour body.  Too much and too little warfarin are both dangerous. Too much warfarin increases the risk of bleeding. Too little warfarin continues to allow the risk for blood clots. Warfarin and Regular Blood Testing While taking warfarin, you will need to have regular blood tests to measure your blood clotting time. These blood tests usually include both the prothrombin time (PT) and international normalized ratio (INR) tests. The PT and INR results allow your health care provider to adjust your dose of warfarin. It is very important that you have your PT and INR tested as often as directed by your health care provider.  Warfarin and Your Diet Avoid major changes in your diet, or notify your health care provider before changing your diet. Arrange a visit with a registered dietitian  to answer your questions. Many foods, especially foods high in vitamin K, can interfere with warfarin and affect the PT and INR results. You should eat a consistent amount of foods high in vitamin K. Foods high in vitamin K include:   Spinach, kale, broccoli, cabbage, collard and turnip greens, Brussels sprouts, peas, cauliflower, seaweed, and parsley.  Beef and pork liver.  Green tea.  Soybean oil. Warfarin with Other Medicines Many medicines can interfere with warfarin and affect the PT and INR results. You must:  Tell your health care provider about any and all medicines, vitamins, and supplements you take, including aspirin and other over-the-counter anti-inflammatory  medicines. Be especially cautious with aspirin and anti-inflammatory medicines. Ask your health care provider before taking these.  Do not take or discontinue any prescribed or over-the-counter medicine except on the advice of your health care provider or pharmacist. Warfarin Side Effects Warfarin can have side effects, such as easy bruising and difficulty stopping bleeding. Ask your health care provider or pharmacist about other side effects of warfarin. You will need to:  Hold pressure over cuts for longer than usual.  Notify your dentist and other health care providers that you are taking warfarin before you undergo any procedures where bleeding may occur. Warfarin with Alcohol and Tobacco   Drinking alcohol frequently can increase the effect of warfarin, leading to excess bleeding. It is best to avoid alcoholic drinks or consume only very small amounts while taking warfarin. Notify your health care provider if you change your alcohol intake.  Do not use any tobacco products including cigarettes, chewing tobacco, or electronic cigarettes. If you smoke, quit. Ask your health care provider for help with quitting smoking. Alternative Medicines to Warfarin: Factor Xa Inhibitor Medicines  These blood thinning medicines are taken by mouth, usually for several weeks or longer. It is important to take the medicine every single day, at the same time each day.  There are no regular blood tests required when using these medicines.  There are fewer food and drug interactions than with warfarin.  The side effects of this class of medicine is similar to that of warfarin, including excessive bruising or bleeding. Ask your health care provider or pharmacist about other potential side effects. SEEK MEDICAL CARE IF:   You notice a rapid heartbeat.  You feel weaker or more tired than usual.  You feel faint.  You notice increased bruising.  Your symptoms are not getting better in the time  expected.  You are having side effects of medicine. SEEK IMMEDIATE MEDICAL CARE IF:   You have chest pain.  You have trouble breathing.  You have new or increased swelling or pain in one leg.  You cough up blood.  You notice blood in vomit, in a bowel movement, or in urine.  You have a fever. Symptoms of PE may represent a serious problem that is an emergency. Do not wait to see if the symptoms will go away. Get medical help right away. Call your local emergency services (911 in the [place]). Do not drive yourself to the hospital. Document Released: 11/03/2000 Document Revised: 03/23/2014 Document Reviewed: 11/17/2013 [facility] Patient Information 2015 [place], [place]. This information is not intended to replace advice given to you by your health care provider. Make sure you discuss any questions you have with your health care provider.  Flank Pain Flank pain refers to pain that is located on the side of the body between the upper abdomen and the back. The pain may  occur over a short period of time (acute) or may be long-term or reoccurring (chronic). It may be mild or severe. Flank pain can be caused by many things. CAUSES  Some of the more common causes of flank pain include:  Muscle strains.   Muscle spasms.   A disease of your spine (vertebral disk disease).   A lung infection (pneumonia).   Fluid around your lungs (pulmonary edema).   A kidney infection.   Kidney stones.   A very painful skin rash caused by the chickenpox virus (shingles).   Gallbladder disease.  HOME CARE INSTRUCTIONS  Home care will depend on the cause of your pain. In general,  Rest as directed by your caregiver.  Drink enough fluids to keep your urine clear or pale yellow.  Only take over-the-counter or prescription medicines as directed by your caregiver. Some medicines may help relieve the pain.  Tell your caregiver about any changes in your pain.  Follow up with your  caregiver as directed. SEEK IMMEDIATE MEDICAL CARE IF:   Your pain is not controlled with medicine.   You have new or worsening symptoms.  Your pain increases.   You have abdominal pain.   You have shortness of breath.   You have persistent nausea or vomiting.   You have swelling in your abdomen.   You feel faint or pass out.   You have blood in your urine.  You have a fever or persistent symptoms for more than 2-3 days.  You have a fever and your symptoms suddenly get worse. MAKE SURE YOU:   Understand these instructions.  Will watch your condition.  Will get help right away if you are not doing well or get worse. Document Released: 12/28/2005 Document Revised: 07/31/2012 Document Reviewed: 06/20/2012 [facility] Patient Information 2015 [place], [place]. This information is not intended to replace advice given to you by your health care provider. Make sure you discuss any questions you have with your health care provider.

## 2015-04-16 ENCOUNTER — Ambulatory Visit (INDEPENDENT_AMBULATORY_CARE_PROVIDER_SITE_OTHER): Payer: Medicaid Other | Admitting: Family Medicine

## 2015-04-16 ENCOUNTER — Encounter: Payer: Self-pay | Admitting: Family Medicine

## 2015-04-16 VITALS — BP 128/81 | HR 88 | Temp 98.0°F | Ht 66.0 in | Wt 222.5 lb

## 2015-04-16 DIAGNOSIS — Z720 Tobacco use: Secondary | ICD-10-CM

## 2015-04-16 DIAGNOSIS — F17200 Nicotine dependence, unspecified, uncomplicated: Secondary | ICD-10-CM

## 2015-04-16 DIAGNOSIS — I10 Essential (primary) hypertension: Secondary | ICD-10-CM

## 2015-04-16 DIAGNOSIS — I2699 Other pulmonary embolism without acute cor pulmonale: Secondary | ICD-10-CM

## 2015-04-16 LAB — COMPREHENSIVE METABOLIC PANEL
ALBUMIN: 3.6 g/dL (ref 3.5–5.2)
ALT: 11 U/L (ref 0–35)
AST: 15 U/L (ref 0–37)
Alkaline Phosphatase: 67 U/L (ref 39–117)
BUN: 9 mg/dL (ref 6–23)
CALCIUM: 8.8 mg/dL (ref 8.4–10.5)
CHLORIDE: 106 meq/L (ref 96–112)
CO2: 25 meq/L (ref 19–32)
CREATININE: 0.72 mg/dL (ref 0.50–1.10)
Glucose, Bld: 85 mg/dL (ref 70–99)
Potassium: 3.8 mEq/L (ref 3.5–5.3)
SODIUM: 138 meq/L (ref 135–145)
Total Bilirubin: 0.2 mg/dL (ref 0.2–1.2)
Total Protein: 6.7 g/dL (ref 6.0–8.3)

## 2015-04-16 LAB — CBC WITH DIFFERENTIAL/PLATELET
BASOS PCT: 0 % (ref 0–1)
Basophils Absolute: 0 10*3/uL (ref 0.0–0.1)
Eosinophils Absolute: 0.2 10*3/uL (ref 0.0–0.7)
Eosinophils Relative: 2 % (ref 0–5)
HEMATOCRIT: 32.3 % — AB (ref 36.0–46.0)
HEMOGLOBIN: 9.6 g/dL — AB (ref 12.0–15.0)
LYMPHS ABS: 3.5 10*3/uL (ref 0.7–4.0)
Lymphocytes Relative: 33 % (ref 12–46)
MCH: 21.2 pg — ABNORMAL LOW (ref 26.0–34.0)
MCHC: 29.7 g/dL — ABNORMAL LOW (ref 30.0–36.0)
MCV: 71.3 fL — AB (ref 78.0–100.0)
MPV: 9.7 fL (ref 8.6–12.4)
Monocytes Absolute: 0.6 10*3/uL (ref 0.1–1.0)
Monocytes Relative: 6 % (ref 3–12)
NEUTROS PCT: 59 % (ref 43–77)
Neutro Abs: 6.3 10*3/uL (ref 1.7–7.7)
Platelets: 496 10*3/uL — ABNORMAL HIGH (ref 150–400)
RBC: 4.53 MIL/uL (ref 3.87–5.11)
RDW: 20.7 % — AB (ref 11.5–15.5)
WBC: 10.7 10*3/uL — ABNORMAL HIGH (ref 4.0–10.5)

## 2015-04-16 MED ORDER — LISINOPRIL-HYDROCHLOROTHIAZIDE 20-25 MG PO TABS: 1.0000 | ORAL_TABLET | Freq: Every day | ORAL | Status: AC

## 2015-04-16 MED ORDER — TRAMADOL HCL 50 MG PO TABS: 50.0000 mg | ORAL_TABLET | Freq: Four times a day (QID) | ORAL | Status: DC | PRN

## 2015-04-16 NOTE — Progress Notes (Signed)
Patient ID: Melissa LenzJosephine Krueger, female   DOB: 10/23/1976, 39 y.o.   MRN: [id number]   HPI  Patient presents today for hospital follow-up  She was admitted on 5/14 for acute pulmonary embolism. She started Xarelto and has been tolerating well since that time. She initially had left-sided chest pain which improved. She now has left-sided back pain described as sharp pain with deep inspiration is nonradiating. It's not bothering her baseline and was not come on except when she takes a deep breath.  She notes overall she has no dyspnea unless she tries to take as deep breaths, what feels like her breath is taken away because of pain. She has some improvement with oxycodone given in the ER. She was seen in the ER a few days ago for the back pain and a CT scan was obtained looking for retroperitoneal bleed with new Xarelto prescription which was negative for bleed.  She denies bleeding any place, including nosebleeds, vaginal bleeding, and blood in the stool.  She is not using OCPs, had no evidence of DVT, and had a negative hypercoagulability workup while admitted. She is a smoker, we advised her to stop   Family history reviewed today includes that she has a maternal uncle who has DVT, maternal grandfather who has had leukemia of some sort, and a maternal grandmother with lupus. Her lupus studies were negative  Hypertension No chest pain - except as described above, dyspnea, palpitations, leg edema Taking Prinzide now, previously was not being too compliant Needs a refill  Smoking status noted - current smoker, cutting back, encouraged ROS: Per HPI  Objective: BP 128/81 mmHg  Pulse 88  Temp(Src) 98 F (36.7 C) (Oral)  Ht 5\' 6"  (1.676 m)  Wt 222 lb 8 oz (100.925 kg)  BMI 35.93 kg/m2  LMP 04/03/2015 Gen: NAD, alert, cooperative with exam HEENT: NCAT CV: RRR, good S1/S2, no murmur Resp: CTABL, no wheezes, non-labored Abd: SNTND, BS present, no guarding or organomegaly Ext: No edema,  warm Neuro: Alert and oriented, No gross deficits MSK: Some tenderness to palpation of left upper thoracic area consistent with where she is hurting with deep inspiration  Assessment and plan:  Pulmonary embolism Discussed disease, hypercoagulable work up negative  L sided pleuritic back pain likely related Tramadol for that No cause, discussed smoking cessation    TOBACCO USER Encouraged cessation 1800 quit now   HYPERTENSION, BENIGN SYSTEMIC Well controlled Continue priinzide labs    Orders Placed This Encounter  Procedures  . CBC with Differential  . Comprehensive metabolic panel    Meds ordered this encounter  Medications  . traMADol (ULTRAM) 50 MG tablet    Sig: Take 1-2 tablets (50-100 mg total) by mouth every 6 (six) hours as needed.    Dispense:  120 tablet    Refill:  0  . lisinopril-hydrochlorothiazide (PRINZIDE,ZESTORETIC) 20-25 MG per tablet    Sig: Take 1 tablet by mouth daily.    Dispense:  30 tablet    Refill:  11

## 2015-04-16 NOTE — Assessment & Plan Note (Signed)
Encouraged cessation 1800 quit now

## 2015-04-16 NOTE — Patient Instructions (Signed)
Great to see you again!  Lets follow up in 1 month, sooner if needed  Pulmonary Embolism A pulmonary (lung) embolism (PE) is a blood clot that has traveled to the lung and results in a blockage of blood flow in the affected lung. Most clots come from deep veins in the legs or pelvis. PE is a dangerous and potentially life-threatening condition that can be treated if identified. CAUSES Blood clots form in a vein for different reasons. Usually several things cause blood clots. They include:  The flow of blood slows down.  The inside of the vein is damaged in some way.  The person has a condition that makes the blood clot more easily. RISK FACTORS Some people are more likely than others to develop PE. Risk factors include:   Smoking.  Being overweight (obese).  Sitting or lying still for a long time. This includes long-distance travel, paralysis, or recovery from an illness or surgery. Other factors that increase risk are:   Older age, especially over 21 years of age.  Having a family history of blood clots or if you have already had a blood clot.  Having major or lengthy surgery. This is especially true for surgery on the hip, knee, or belly (abdomen). Hip surgery is particularly high risk.  Having a long, thin tube (catheter) placed inside a vein during a medical procedure.  Breaking a hip or leg.  Having cancer or cancer treatment.  Medicines containing the female hormone estrogen. This includes birth control pills and hormone replacement therapy.  Other circulation or heart problems.  Pregnancy and childbirth.  Hormone changes make the blood clot more easily during pregnancy.  The fetus puts pressure on the veins of the pelvis.  There is a risk of injury to veins during delivery or a caesarean delivery. The risk is highest just after childbirth.  PREVENTION   Exercise the legs regularly. Take a brisk 30 minute walk every day.  Maintain a weight that is appropriate  for your height.  Avoid sitting or lying in bed for long periods of time without moving your legs.  Women, particularly those over the age of 35 years, should consider the risks and benefits of taking estrogen medicines, including birth control pills.  Do not smoke, especially if you take estrogen medicines.  Long-distance travel can increase your risk. You should exercise your legs by walking or pumping the muscles every hour.  Many of the risk factors above relate to situations that exist with hospitalization, either for illness, injury, or elective surgery. Prevention may include medical and nonmedical measures.   Your health care provider will assess you for the need for venous thromboembolism prevention when you are admitted to the hospital. If you are having surgery, your surgeon will assess you the day of or day after surgery.  SYMPTOMS  The symptoms of a PE usually start suddenly and include:  Shortness of breath.  Coughing.  Coughing up blood or blood-tinged mucus.  Chest pain. Pain is often worse with deep breaths.  Rapid heartbeat. DIAGNOSIS  If a PE is suspected, your health care provider will take a medical history and perform a physical exam. Other tests that may be required include:  Blood tests, such as studies of the clotting properties of your blood.  Imaging tests, such as ultrasound, CT, MRI, and other tests to see if you have clots in your legs or lungs.  An electrocardiogram. This can look for heart strain from blood clots in the lungs. TREATMENT  The most common treatment for a PE is blood thinning (anticoagulant) medicine, which reduces the blood's tendency to clot. Anticoagulants can stop new blood clots from forming and old clots from growing. They cannot dissolve existing clots. Your body does this by itself over time. Anticoagulants can be given by mouth, through an intravenous (IV) tube, or by injection. Your health care provider will determine the  best program for you.  Less commonly, clot-dissolving medicines (thrombolytics) are used to dissolve a PE. They carry a high risk of bleeding, so they are used mainly in severe cases.  Very rarely, a blood clot in the leg needs to be removed surgically.  If you are unable to take anticoagulants, your health care provider may arrange for you to have a filter placed in a main vein in your abdomen. This filter prevents clots from traveling to your lungs. HOME CARE INSTRUCTIONS   Take all medicines as directed by your health care provider.  Learn as much as you can about DVT.  Wear a medical alert bracelet or carry a medical alert card.  Ask your health care provider how soon you can go back to normal activities. It is important to stay active to prevent blood clots. If you are on anticoagulant medicine, avoid contact sports.  It is very important to exercise. This is especially important while traveling, sitting, or standing for long periods of time. Exercise your legs by walking or by tightening and relaxing your leg muscles regularly. Take frequent walks.  You may need to wear compression stockings. These are tight elastic stockings that apply pressure to the lower legs. This pressure can help keep the blood in the legs from clotting. Taking Warfarin Warfarin is a daily medicine that is taken by mouth. Your health care provider will advise you on the length of treatment (usually 3-6 months, sometimes lifelong). If you take warfarin:  Understand how to take warfarin and foods that can affect how warfarin works in Public relations account executiveyour body.  Too much and too little warfarin are both dangerous. Too much warfarin increases the risk of bleeding. Too little warfarin continues to allow the risk for blood clots. Warfarin and Regular Blood Testing While taking warfarin, you will need to have regular blood tests to measure your blood clotting time. These blood tests usually include both the prothrombin time (PT) and  international normalized ratio (INR) tests. The PT and INR results allow your health care provider to adjust your dose of warfarin. It is very important that you have your PT and INR tested as often as directed by your health care provider.  Warfarin and Your Diet Avoid major changes in your diet, or notify your health care provider before changing your diet. Arrange a visit with a registered dietitian to answer your questions. Many foods, especially foods high in vitamin K, can interfere with warfarin and affect the PT and INR results. You should eat a consistent amount of foods high in vitamin K. Foods high in vitamin K include:   Spinach, kale, broccoli, cabbage, collard and turnip greens, Brussels sprouts, peas, cauliflower, seaweed, and parsley.  Beef and pork liver.  Green tea.  Soybean oil. Warfarin with Other Medicines Many medicines can interfere with warfarin and affect the PT and INR results. You must:  Tell your health care provider about any and all medicines, vitamins, and supplements you take, including aspirin and other over-the-counter anti-inflammatory medicines. Be especially cautious with aspirin and anti-inflammatory medicines. Ask your health care provider before taking these.  Do not take or discontinue any prescribed or over-the-counter medicine except on the advice of your health care provider or pharmacist. Warfarin Side Effects Warfarin can have side effects, such as easy bruising and difficulty stopping bleeding. Ask your health care provider or pharmacist about other side effects of warfarin. You will need to:  Hold pressure over cuts for longer than usual.  Notify your dentist and other health care providers that you are taking warfarin before you undergo any procedures where bleeding may occur. Warfarin with Alcohol and Tobacco   Drinking alcohol frequently can increase the effect of warfarin, leading to excess bleeding. It is best to avoid alcoholic drinks or  consume only very small amounts while taking warfarin. Notify your health care provider if you change your alcohol intake.  Do not use any tobacco products including cigarettes, chewing tobacco, or electronic cigarettes. If you smoke, quit. Ask your health care provider for help with quitting smoking. Alternative Medicines to Warfarin: Factor Xa Inhibitor Medicines  These blood thinning medicines are taken by mouth, usually for several weeks or longer. It is important to take the medicine every single day, at the same time each day.  There are no regular blood tests required when using these medicines.  There are fewer food and drug interactions than with warfarin.  The side effects of this class of medicine is similar to that of warfarin, including excessive bruising or bleeding. Ask your health care provider or pharmacist about other potential side effects. SEEK MEDICAL CARE IF:   You notice a rapid heartbeat.  You feel weaker or more tired than usual.  You feel faint.  You notice increased bruising.  Your symptoms are not getting better in the time expected.  You are having side effects of medicine. SEEK IMMEDIATE MEDICAL CARE IF:   You have chest pain.  You have trouble breathing.  You have new or increased swelling or pain in one leg.  You cough up blood.  You notice blood in vomit, in a bowel movement, or in urine.  You have a fever. Symptoms of PE may represent a serious problem that is an emergency. Do not wait to see if the symptoms will go away. Get medical help right away. Call your local emergency services (911 in the [place]). Do not drive yourself to the hospital. Document Released: 11/03/2000 Document Revised: 03/23/2014 Document Reviewed: 11/17/2013 [facility] Patient Information 2015 [place], [place]. This information is not intended to replace advice given to you by your health care provider. Make sure you discuss any questions you have with your health  care provider.

## 2015-04-16 NOTE — Assessment & Plan Note (Signed)
Discussed disease, hypercoagulable work up negative  L sided pleuritic back pain likely related Tramadol for that No cause, discussed smoking cessation

## 2015-04-16 NOTE — Assessment & Plan Note (Signed)
Well controlled Continue priinzide labs

## 2015-04-20 ENCOUNTER — Encounter: Payer: Self-pay | Admitting: Family Medicine

## 2015-04-28 ENCOUNTER — Other Ambulatory Visit: Payer: Self-pay | Admitting: Family Medicine

## 2015-04-28 MED ORDER — RIVAROXABAN 20 MG PO TABS: 20.0000 mg | ORAL_TABLET | Freq: Every day | ORAL | Status: DC

## 2015-04-28 NOTE — Telephone Encounter (Signed)
Left message informing of message from MD.

## 2015-04-28 NOTE — Telephone Encounter (Signed)
Needs refill on xaralto Was given by dr in hospital but she says Melissa Krueger said he wanted her to stay on it Please advice

## 2015-04-28 NOTE — Telephone Encounter (Signed)
Refilled xarelto, will ask nursing to inform.   Murtis SinkSam Larico Dimock, MD [facility] Resident, PGY-3 04/28/2015, 1:44 PM

## 2015-05-11 ENCOUNTER — Ambulatory Visit: Payer: Medicaid Other | Admitting: Family Medicine

## 2015-05-12 ENCOUNTER — Ambulatory Visit: Payer: Medicaid Other | Admitting: Family Medicine

## 2016-02-24 ENCOUNTER — Emergency Department (HOSPITAL_COMMUNITY): Payer: Medicaid Other

## 2016-02-24 ENCOUNTER — Encounter (HOSPITAL_COMMUNITY): Payer: Self-pay | Admitting: Emergency Medicine

## 2016-02-24 ENCOUNTER — Emergency Department (HOSPITAL_COMMUNITY)
Admission: EM | Admit: 2016-02-24 | Discharge: 2016-02-24 | Disposition: A | Discharge status: Home / Self Care | Payer: Medicaid Other | Attending: Emergency Medicine | Admitting: Emergency Medicine

## 2016-02-24 DIAGNOSIS — Z299 Encounter for prophylactic measures, unspecified: Secondary | ICD-10-CM

## 2016-02-24 DIAGNOSIS — E669 Obesity, unspecified: Secondary | ICD-10-CM | POA: Insufficient documentation

## 2016-02-24 DIAGNOSIS — M25561 Pain in right knee: Secondary | ICD-10-CM | POA: Diagnosis present

## 2016-02-24 DIAGNOSIS — M79661 Pain in right lower leg: Secondary | ICD-10-CM | POA: Diagnosis not present

## 2016-02-24 DIAGNOSIS — D649 Anemia, unspecified: Secondary | ICD-10-CM | POA: Diagnosis not present

## 2016-02-24 DIAGNOSIS — I2699 Other pulmonary embolism without acute cor pulmonale: Secondary | ICD-10-CM

## 2016-02-24 DIAGNOSIS — Z8701 Personal history of pneumonia (recurrent): Secondary | ICD-10-CM | POA: Insufficient documentation

## 2016-02-24 DIAGNOSIS — I10 Essential (primary) hypertension: Secondary | ICD-10-CM | POA: Diagnosis not present

## 2016-02-24 DIAGNOSIS — Z79899 Other long term (current) drug therapy: Secondary | ICD-10-CM | POA: Diagnosis not present

## 2016-02-24 DIAGNOSIS — F17210 Nicotine dependence, cigarettes, uncomplicated: Secondary | ICD-10-CM | POA: Diagnosis not present

## 2016-02-24 DIAGNOSIS — Z8742 Personal history of other diseases of the female genital tract: Secondary | ICD-10-CM | POA: Diagnosis not present

## 2016-02-24 DIAGNOSIS — R000 Tachycardia, unspecified: Secondary | ICD-10-CM | POA: Diagnosis not present

## 2016-02-24 DIAGNOSIS — M79662 Pain in left lower leg: Secondary | ICD-10-CM

## 2016-02-24 LAB — PROTIME-INR
INR: 1.14 (ref 0.00–1.49)
Prothrombin Time: 14.4 seconds (ref 11.6–15.2)

## 2016-02-24 LAB — CBC WITH DIFFERENTIAL/PLATELET
Basophils Absolute: 0 10*3/uL (ref 0.0–0.1)
Basophils Relative: 0 %
Eosinophils Absolute: 0.2 10*3/uL (ref 0.0–0.7)
Eosinophils Relative: 1 %
HCT: 30.2 % — ABNORMAL LOW (ref 36.0–46.0)
Hemoglobin: 9.5 g/dL — ABNORMAL LOW (ref 12.0–15.0)
Lymphocytes Relative: 33 %
Lymphs Abs: 3.9 10*3/uL (ref 0.7–4.0)
MCH: 19.9 pg — ABNORMAL LOW (ref 26.0–34.0)
MCHC: 31.5 g/dL (ref 30.0–36.0)
MCV: 63.3 fL — ABNORMAL LOW (ref 78.0–100.0)
Monocytes Absolute: 0.8 10*3/uL (ref 0.1–1.0)
Monocytes Relative: 6 %
Neutro Abs: 7.1 10*3/uL (ref 1.7–7.7)
Neutrophils Relative %: 60 %
Platelets: 389 10*3/uL (ref 150–400)
RBC: 4.77 MIL/uL (ref 3.87–5.11)
RDW: 18.9 % — ABNORMAL HIGH (ref 11.5–15.5)
WBC: 12 10*3/uL — ABNORMAL HIGH (ref 4.0–10.5)

## 2016-02-24 LAB — COMPREHENSIVE METABOLIC PANEL
ALT: 11 U/L — ABNORMAL LOW (ref 14–54)
ANION GAP: 9 (ref 5–15)
AST: 13 U/L — ABNORMAL LOW (ref 15–41)
Albumin: 3.6 g/dL (ref 3.5–5.0)
Alkaline Phosphatase: 77 U/L (ref 38–126)
BUN: 7 mg/dL (ref 6–20)
CALCIUM: 8.6 mg/dL — AB (ref 8.9–10.3)
CO2: 20 mmol/L — AB (ref 22–32)
Chloride: 108 mmol/L (ref 101–111)
Creatinine, Ser: 0.71 mg/dL (ref 0.44–1.00)
GFR calc non Af Amer: >60 mL/min (ref >60)
Glucose, Bld: 97 mg/dL (ref 65–99)
POTASSIUM: 3.5 mmol/L (ref 3.5–5.1)
Sodium: 137 mmol/L (ref 135–145)
TOTAL PROTEIN: 7.4 g/dL (ref 6.5–8.1)
Total Bilirubin: 0.3 mg/dL (ref 0.3–1.2)

## 2016-02-24 MED ORDER — TRAMADOL HCL 50 MG PO TABS
50.0000 mg | ORAL_TABLET | Freq: Once | ORAL | Status: DC
  Filled 2016-02-24: qty 1

## 2016-02-24 MED ORDER — IOPAMIDOL (ISOVUE-370) INJECTION 76%
100.0000 mL | Freq: Once | INTRAVENOUS | Status: AC | PRN
Start: 2016-02-24 — End: 2016-02-24
  Administered 2016-02-24: 100 mL via INTRAVENOUS

## 2016-02-24 MED ORDER — TRAMADOL HCL 50 MG PO TABS: 50.0000 mg | ORAL_TABLET | Freq: Four times a day (QID) | ORAL | Status: DC | PRN

## 2016-02-24 MED ORDER — HYDROCODONE-ACETAMINOPHEN 5-325 MG PO TABS
1.0000 | ORAL_TABLET | Freq: Once | ORAL | Status: AC
  Administered 2016-02-24: 1 via ORAL
  Filled 2016-02-24: qty 1

## 2016-02-24 MED ORDER — ENOXAPARIN SODIUM 100 MG/ML ~~LOC~~ SOLN
100.0000 mg | Freq: Once | SUBCUTANEOUS | Status: AC
  Administered 2016-02-24: 100 mg via SUBCUTANEOUS
  Filled 2016-02-24: qty 1

## 2016-02-24 MED ORDER — HYDROCODONE-ACETAMINOPHEN 5-325 MG PO TABS: 2.0000 | ORAL_TABLET | ORAL | Status: DC | PRN

## 2016-02-24 NOTE — ED Notes (Signed)
Per pt, states right knee pain without injury for 5 days

## 2016-02-24 NOTE — Discharge Instructions (Signed)
Tonight you had an CT Scan of your chest to rule out pulmonary emboli due to your calf pain and swelling  Fortunatly this was negative  You were than given an injection Of Lovonox--a blood thinner and aske to return in the morning for a vascular doppler of your left calf to rule out DVT-blood clot  [facility]:30 AM with these documents for the doppler study

## 2016-02-24 NOTE — ED Notes (Signed)
Pt transported to CT ?

## 2016-02-24 NOTE — ED Provider Notes (Signed)
CSN: [id number]     Arrival date & time 02/24/16  1725 History  By signing my name below, I, Soijett Blue, attest that this documentation has been prepared under the direction and in the presence of Earley FavorGail Tashea Othman, NP Electronically Signed: Soijett Blue, ED Scribe. 02/24/2016. 8:07 PM.   Chief Complaint  Patient presents with  . Knee Pain     The history is provided by the patient. No language interpreter was used.    Melissa Krueger is a 40 y.o. female with a medical hx of HTN who presents to the Emergency Department complaining of right calf pain onset 5 days. Pt reports that her right calf pain is similar to when she had a PE to which she was placed on xarelto x 3 months. Pt was taken off xarelto 1 year ago and she notes that her current symptoms feel similar to when she had a PE. Pt denies any known injury/trauma. Pt has not informed her PCP of her symptoms as of yet. She notes that she has tried ASA, aleve, tylenol with no relief of her symptoms. She denies gait problem, CP, SOB, calf swelling, palpitations, cough, color change, wound, rash, and any other symptoms.    Past Medical History  Diagnosis Date  . Hypertension   . Anemia   . Syncope   . Obesity   . Menorrhagia    Past Surgical History  Procedure Laterality Date  . Tubal ligation    . Breast surgery     Family History  Problem Relation Age of Onset  . Hypertension Mother   . Hyperlipidemia Mother   . Diabetes Father   . Alcohol abuse Father   . Lupus Sister    Social History  Substance Use Topics  . Smoking status: Current Every Day Smoker -- 0.25 packs/day    Types: Cigarettes  . Smokeless tobacco: Never Used  . Alcohol Use: Yes     Comment: occasional   OB History    No data available     Review of Systems  Musculoskeletal: Positive for myalgias. Negative for joint swelling, arthralgias and gait problem.  Skin: Negative for color change, rash and wound.  All other systems reviewed and are  negative.     Allergies  Review of patient's allergies indicates no known allergies.  Home Medications   Prior to Admission medications   Medication Sig Start Date End Date Taking? Authorizing Provider  acetaminophen (TYLENOL) 325 MG tablet Take 2 tablets (650 mg total) by mouth every 6 (six) hours as needed for mild pain (or Fever >/= 101). 04/04/15   Alison MurrayAlma M Devine, MD  ferrous sulfate 325 (65 FE) MG tablet Take 1 tablet (325 mg total) by mouth daily with breakfast. 05/29/14   Elenora GammaSamuel L Bradshaw, MD  lisinopril-hydrochlorothiazide (PRINZIDE,ZESTORETIC) 20-25 MG per tablet Take 1 tablet by mouth daily. 04/16/15   Elenora GammaSamuel L Bradshaw, MD  oxyCODONE (ROXICODONE) 5 MG immediate release tablet Take 1 tablet (5 mg total) by mouth 3 (three) times daily as needed for severe pain or breakthrough pain. 04/11/15   Tomasita CrumbleAdeleke Oni, MD  pyridOXINE (VITAMIN B-6) 100 MG tablet Take 100 mg by mouth daily.    Historical Provider, MD  rivaroxaban (XARELTO) 20 MG TABS tablet Take 1 tablet (20 mg total) by mouth daily with supper. 04/28/15   Elenora GammaSamuel L Bradshaw, MD  traMADol (ULTRAM) 50 MG tablet Take 1-2 tablets (50-100 mg total) by mouth every 6 (six) hours as needed. 02/24/16   Earley FavorGail Laityn Bensen, NP  vitamin C (ASCORBIC ACID) 500 MG tablet Take 500 mg by mouth daily.    Historical Provider, MD   BP 137/87 mmHg  Pulse 110  Temp(Src) 98 F (36.7 C) (Oral)  Resp 18  SpO2 100%  LMP 01/31/2016 Physical Exam  Constitutional: She is oriented to person, place, and time. She appears well-developed and well-nourished. No distress.  HENT:  Head: Normocephalic and atraumatic.  Eyes: EOM are normal.  Neck: Neck supple.  Cardiovascular: Regular rhythm and normal heart sounds.  Tachycardia present.  Exam reveals no gallop and no friction rub.   No murmur heard. Pulmonary/Chest: Effort normal and breath sounds normal. No respiratory distress. She has no wheezes. She has no rales.  Lungs clear to ausculation bilaterally.   Musculoskeletal: Normal range of motion.       Right lower leg: She exhibits tenderness. She exhibits no swelling.  Positive homans sign and posterior mid calf tenderness without discoloration or swelling.  Neurological: She is alert and oriented to person, place, and time.  Skin: Skin is warm and dry.  Psychiatric: She has a normal mood and affect. Her behavior is normal.  Nursing note and vitals reviewed.   ED Course  Procedures (including critical care time) DIAGNOSTIC STUDIES: Oxygen Saturation is 100% on RA, nl by my interpretation.    COORDINATION OF CARE: 8:07 PM Discussed treatment plan with pt at bedside which includes labs, CT angio chest PE, Lovenox injection, and outpatient US venous doppler tomorrow and pt agreed to plan.    Labs Review Labs Reviewed  CBC WITH DIFFERENTIAL/PLATELET - Abnormal; Notable for the following:    WBC 12.0 (*)    Hemoglobin 9.5 (*)    HCT 30.2 (*)    MCV 63.3 (*)    MCH 19.9 (*)    RDW 18.9 (*)    All other components within normal limits  COMPREHENSIVE METABOLIC PANEL - Abnormal; Notable for the following:    CO2 20 (*)    Calcium 8.6 (*)    AST 13 (*)    ALT 11 (*)    All other components within normal limits  PROTIME-INR    Imaging Review Ct Angio Chest Pe W/cm &/or Wo Cm  02/24/2016  CLINICAL DATA:  Tachycardia. Right calf pain. History of pulmonary embolus. EXAM: CT ANGIOGRAPHY CHEST WITH CONTRAST TECHNIQUE: Multidetector CT imaging of the chest was performed using the standard protocol during bolus administration of intravenous contrast. Multiplanar CT image reconstructions and MIPs were obtained to evaluate the vascular anatomy. CONTRAST:  100 mL Isovue 370 IV COMPARISON:  Chest CT 04/03/2015 FINDINGS: There are no filling defects within the pulmonary arteries to suggest pulmonary embolus. The previous questions tiny left lower lobe embolus has resolved. Normal heart size. Normal caliber thoracic aorta without dissection. Small  bilateral hilar and prevascular lymph nodes, not enlarged by size criteria. No mediastinal adenopathy. No pericardial effusion. Clear lungs. No suspicious nodule, mass or consolidation. No pulmonary edema. Trace fissural thickening of the major fissure on the right. No pleural effusion. No acute abnormality in the included upper abdomen. Esophagus is decompressed. Dystrophic calcification in the right breast, may be a remote trauma. There are no acute or suspicious osseous abnormalities. Review of the MIP images confirms the above findings. IMPRESSION: 1. No pulmonary embolus. Previous left lower lobe embolus has resolved. 2. No acute intrathoracic process. Electronically Signed   By: Rubye Oaks M.D.   On: 02/24/2016 21:38   I have personally reviewed and evaluated these images and lab  results as part of my medical decision-making.   EKG Interpretation None     Dur to Hx of PE with current symptoms of calf pain X 1 week tachycardia will obtain Ct Angio to RU PE  If negative will give Lovenox and bring patinet back for out[patient doppler in the morning   Ct Angio negative will follow above plan  patient in agreement  Patient now states that Ultram make her nauseated although it is on her medication list  MDM   Final diagnoses:  DVT prophylaxis  Calf pain, left   I personally performed the services described in this documentation, which was scribed in my presence. The recorded information has been reviewed and is accurate.   Earley Favor, NP 02/24/16 2220  Earley Favor, NP 02/24/16 1610  Alvira Monday, MD 02/28/16 [id number]

## 2016-02-25 ENCOUNTER — Ambulatory Visit (HOSPITAL_COMMUNITY)
Admission: RE | Admit: 2016-02-25 | Discharge: 2016-02-25 | Disposition: A | Discharge status: Home / Self Care | Payer: Medicaid Other | Source: Ambulatory Visit | Attending: Emergency Medicine | Admitting: Emergency Medicine

## 2016-02-25 DIAGNOSIS — M79609 Pain in unspecified limb: Secondary | ICD-10-CM

## 2016-02-25 DIAGNOSIS — M79662 Pain in left lower leg: Secondary | ICD-10-CM | POA: Diagnosis not present

## 2016-02-25 DIAGNOSIS — Z86711 Personal history of pulmonary embolism: Secondary | ICD-10-CM | POA: Insufficient documentation

## 2016-02-25 NOTE — Progress Notes (Signed)
*  Preliminary Results* Left lower extremity venous duplex completed. Left lower extremity is negative for deep vein thrombosis. There is no evidence of left Baker's cyst.  02/25/2016 8:58 AM  Gertie FeyMichelle Lucas Exline, RVT, RDCS, RDMS

## 2017-12-19 ENCOUNTER — Emergency Department (HOSPITAL_COMMUNITY): Payer: Self-pay

## 2017-12-19 ENCOUNTER — Encounter (HOSPITAL_COMMUNITY): Payer: Self-pay | Admitting: Neurology

## 2017-12-19 ENCOUNTER — Inpatient Hospital Stay (HOSPITAL_COMMUNITY)
Admission: EM | Admit: 2017-12-19 | Discharge: 2017-12-21 | DRG: 065 | Disposition: A | Discharge status: Home Health | Payer: Self-pay | Attending: Family Medicine | Admitting: Family Medicine

## 2017-12-19 ENCOUNTER — Other Ambulatory Visit: Payer: Self-pay

## 2017-12-19 DIAGNOSIS — I639 Cerebral infarction, unspecified: Secondary | ICD-10-CM

## 2017-12-19 DIAGNOSIS — Z86711 Personal history of pulmonary embolism: Secondary | ICD-10-CM

## 2017-12-19 DIAGNOSIS — R531 Weakness: Secondary | ICD-10-CM

## 2017-12-19 DIAGNOSIS — Z86718 Personal history of other venous thrombosis and embolism: Secondary | ICD-10-CM

## 2017-12-19 DIAGNOSIS — I7774 Dissection of vertebral artery: Secondary | ICD-10-CM

## 2017-12-19 DIAGNOSIS — I10 Essential (primary) hypertension: Secondary | ICD-10-CM | POA: Diagnosis present

## 2017-12-19 DIAGNOSIS — E785 Hyperlipidemia, unspecified: Secondary | ICD-10-CM | POA: Diagnosis present

## 2017-12-19 DIAGNOSIS — F1210 Cannabis abuse, uncomplicated: Secondary | ICD-10-CM

## 2017-12-19 DIAGNOSIS — I6340 Cerebral infarction due to embolism of unspecified cerebral artery: Secondary | ICD-10-CM

## 2017-12-19 DIAGNOSIS — G8194 Hemiplegia, unspecified affecting left nondominant side: Secondary | ICD-10-CM | POA: Diagnosis present

## 2017-12-19 DIAGNOSIS — F17200 Nicotine dependence, unspecified, uncomplicated: Secondary | ICD-10-CM

## 2017-12-19 DIAGNOSIS — F17210 Nicotine dependence, cigarettes, uncomplicated: Secondary | ICD-10-CM | POA: Diagnosis present

## 2017-12-19 DIAGNOSIS — I6381 Other cerebral infarction due to occlusion or stenosis of small artery: Principal | ICD-10-CM | POA: Diagnosis present

## 2017-12-19 DIAGNOSIS — E876 Hypokalemia: Secondary | ICD-10-CM | POA: Diagnosis present

## 2017-12-19 DIAGNOSIS — R29702 NIHSS score 2: Secondary | ICD-10-CM | POA: Diagnosis present

## 2017-12-19 DIAGNOSIS — Z7901 Long term (current) use of anticoagulants: Secondary | ICD-10-CM

## 2017-12-19 DIAGNOSIS — Z23 Encounter for immunization: Secondary | ICD-10-CM

## 2017-12-19 DIAGNOSIS — Z9114 Patient's other noncompliance with medication regimen: Secondary | ICD-10-CM

## 2017-12-19 HISTORY — DX: Headache: R51

## 2017-12-19 HISTORY — DX: Cardiac murmur, unspecified: R01.1

## 2017-12-19 HISTORY — DX: Cerebral infarction, unspecified: I63.9

## 2017-12-19 HISTORY — DX: Headache, unspecified: R51.9

## 2017-12-19 HISTORY — DX: Acute embolism and thrombosis of unspecified deep veins of unspecified lower extremity: I82.409

## 2017-12-19 HISTORY — DX: Other pulmonary embolism without acute cor pulmonale: I26.99

## 2017-12-19 LAB — CBC
HCT: 30.8 % — ABNORMAL LOW (ref 36.0–46.0)
HCT: 30.9 % — ABNORMAL LOW (ref 36.0–46.0)
Hemoglobin: 9.6 g/dL — ABNORMAL LOW (ref 12.0–15.0)
Hemoglobin: 9.6 g/dL — ABNORMAL LOW (ref 12.0–15.0)
MCH: 19.9 pg — AB (ref 26.0–34.0)
MCH: 19.8 pg — ABNORMAL LOW (ref 26.0–34.0)
MCHC: 31.2 g/dL (ref 30.0–36.0)
MCHC: 31.1 g/dL (ref 30.0–36.0)
MCV: 63.8 fL — AB (ref 78.0–100.0)
MCV: 63.8 fL — ABNORMAL LOW (ref 78.0–100.0)
PLATELETS: 420 10*3/uL — AB (ref 150–400)
Platelets: 389 10*3/uL (ref 150–400)
RBC: 4.83 MIL/uL (ref 3.87–5.11)
RBC: 4.84 MIL/uL (ref 3.87–5.11)
RDW: 19.3 % — ABNORMAL HIGH (ref 11.5–15.5)
RDW: 19.2 % — ABNORMAL HIGH (ref 11.5–15.5)
WBC: 9.4 10*3/uL (ref 4.0–10.5)
WBC: 8.8 10*3/uL (ref 4.0–10.5)

## 2017-12-19 LAB — DIFFERENTIAL
BASOS PCT: 0 %
Basophils Absolute: 0 10*3/uL (ref 0.0–0.1)
EOS ABS: 0.2 10*3/uL (ref 0.0–0.7)
Eosinophils Relative: 2 %
LYMPHS PCT: 28 %
Lymphs Abs: 2.6 10*3/uL (ref 0.7–4.0)
MONOS PCT: 5 %
Monocytes Absolute: 0.5 10*3/uL (ref 0.1–1.0)
NEUTROS ABS: 6.1 10*3/uL (ref 1.7–7.7)
Neutrophils Relative %: 65 %

## 2017-12-19 LAB — CREATININE, SERUM: CREATININE: 0.64 mg/dL (ref 0.44–1.00)

## 2017-12-19 LAB — APTT: aPTT: 24 seconds (ref 24–36)

## 2017-12-19 LAB — COMPREHENSIVE METABOLIC PANEL
ALK PHOS: 87 U/L (ref 38–126)
ALT: 14 U/L (ref 14–54)
AST: 24 U/L (ref 15–41)
Albumin: 3.5 g/dL (ref 3.5–5.0)
Anion gap: 10 (ref 5–15)
BUN: <5 mg/dL — ABNORMAL LOW (ref 6–20)
CALCIUM: 8.6 mg/dL — AB (ref 8.9–10.3)
CO2: 21 mmol/L — ABNORMAL LOW (ref 22–32)
CREATININE: 0.66 mg/dL (ref 0.44–1.00)
Chloride: 108 mmol/L (ref 101–111)
Glucose, Bld: 101 mg/dL — ABNORMAL HIGH (ref 65–99)
Potassium: 3.5 mmol/L (ref 3.5–5.1)
Sodium: 139 mmol/L (ref 135–145)
Total Bilirubin: 0.4 mg/dL (ref 0.3–1.2)
Total Protein: 6.7 g/dL (ref 6.5–8.1)

## 2017-12-19 LAB — I-STAT CHEM 8, ED
BUN: <3 mg/dL — ABNORMAL LOW (ref 6–20)
CREATININE: 0.6 mg/dL (ref 0.44–1.00)
Calcium, Ion: 1.14 mmol/L — ABNORMAL LOW (ref 1.15–1.40)
Chloride: 105 mmol/L (ref 101–111)
GLUCOSE: 101 mg/dL — AB (ref 65–99)
HCT: 34 % — ABNORMAL LOW (ref 36.0–46.0)
Hemoglobin: 11.6 g/dL — ABNORMAL LOW (ref 12.0–15.0)
Potassium: 3.4 mmol/L — ABNORMAL LOW (ref 3.5–5.1)
Sodium: 141 mmol/L (ref 135–145)
TCO2: 24 mmol/L (ref 22–32)

## 2017-12-19 LAB — PROTIME-INR
INR: 0.96
PROTHROMBIN TIME: 12.7 s (ref 11.4–15.2)

## 2017-12-19 LAB — TSH: TSH: 0.597 u[IU]/mL (ref 0.350–4.500)

## 2017-12-19 LAB — CBG MONITORING, ED: Glucose-Capillary: 84 mg/dL (ref 65–99)

## 2017-12-19 LAB — I-STAT BETA HCG BLOOD, ED (MC, WL, AP ONLY): I-stat hCG, quantitative: <5 m[IU]/mL (ref <5)

## 2017-12-19 LAB — I-STAT TROPONIN, ED: TROPONIN I, POC: 0 ng/mL (ref 0.00–0.08)

## 2017-12-19 MED ORDER — ASPIRIN EC 81 MG PO TBEC
81.0000 mg | DELAYED_RELEASE_TABLET | Freq: Every day | ORAL | Status: DC
  Administered 2017-12-20: 81 mg via ORAL
  Filled 2017-12-19: qty 1

## 2017-12-19 MED ORDER — ACETAMINOPHEN 325 MG PO TABS
650.0000 mg | ORAL_TABLET | ORAL | Status: DC | PRN
  Administered 2017-12-20: 650 mg via ORAL
  Filled 2017-12-19: qty 2

## 2017-12-19 MED ORDER — SODIUM CHLORIDE 0.9 % IV SOLN
INTRAVENOUS | Status: DC
  Administered 2017-12-19: 14:00:00 via INTRAVENOUS

## 2017-12-19 MED ORDER — ACETAMINOPHEN 650 MG RE SUPP: 650.0000 mg | RECTAL | Status: DC | PRN

## 2017-12-19 MED ORDER — IBUPROFEN 200 MG PO TABS
400.0000 mg | ORAL_TABLET | Freq: Four times a day (QID) | ORAL | Status: DC | PRN
  Administered 2017-12-20 – 2017-12-21 (×2): 400 mg via ORAL
  Filled 2017-12-19 (×2): qty 2

## 2017-12-19 MED ORDER — PNEUMOCOCCAL VAC POLYVALENT 25 MCG/0.5ML IJ INJ
0.5000 mL | INJECTION | INTRAMUSCULAR | Status: AC
  Administered 2017-12-20: 0.5 mL via INTRAMUSCULAR
  Filled 2017-12-19: qty 0.5

## 2017-12-19 MED ORDER — ACETAMINOPHEN 160 MG/5ML PO SOLN: 650.0000 mg | ORAL | Status: DC | PRN

## 2017-12-19 MED ORDER — SENNOSIDES-DOCUSATE SODIUM 8.6-50 MG PO TABS
1.0000 | ORAL_TABLET | Freq: Every evening | ORAL | Status: DC | PRN
Start: 2017-12-19 — End: 2017-12-21

## 2017-12-19 MED ORDER — ATORVASTATIN CALCIUM 80 MG PO TABS
80.0000 mg | ORAL_TABLET | Freq: Every day | ORAL | Status: DC
  Administered 2017-12-19 – 2017-12-20 (×2): 80 mg via ORAL
  Filled 2017-12-19: qty 1
  Filled 2017-12-19: qty 2
  Filled 2017-12-19: qty 1

## 2017-12-19 MED ORDER — ENOXAPARIN SODIUM 40 MG/0.4ML ~~LOC~~ SOLN
40.0000 mg | SUBCUTANEOUS | Status: DC
  Administered 2017-12-19 – 2017-12-20 (×2): 40 mg via SUBCUTANEOUS
  Filled 2017-12-19 (×3): qty 0.4

## 2017-12-19 MED ORDER — ACETAMINOPHEN 500 MG PO TABS
1000.0000 mg | ORAL_TABLET | Freq: Once | ORAL | Status: AC
  Administered 2017-12-19: 1000 mg via ORAL
  Filled 2017-12-19: qty 2

## 2017-12-19 MED ORDER — STROKE: EARLY STAGES OF RECOVERY BOOK
Freq: Once | Status: AC
  Administered 2017-12-21: 06:00:00
  Filled 2017-12-19: qty 1

## 2017-12-19 NOTE — Code Documentation (Signed)
41[facility].  Patient from work where she had sudden onset dizziness, headache, left sided numbness, and difficulty walking at 0940.  Code stroke called in the ED.  Stroke team to the bedside.  CT completed.  NIHSS 2, see documentation for details and code stroke times.  Patient with left leg drift and reports decreased sensation in the left arm.  Patient is too mild to treat with tPA at this time, however, she remains in the window to treat until 1410 should symptoms worsen.  Bedside handoff with ED RN Amil AmenJulia.

## 2017-12-19 NOTE — Consult Note (Signed)
Requesting Physician: Dr. Fredderick Phenix    Chief Complaint: Code stroke  History obtained from:  Patient   HPI:                                                                                                                                         Melissa Krueger is an 42 y.o. female works at [employer] and noted that at approximately 940 she was going back to the break room and her left face and left arm had decreased sensation along with she had a constant headache.  She then noted that she was dizzy, and when asked about the dizziness it was not vertigo but just was a headed.  Currently she continues to have a headache but only has decreased sensation on the left face but not the arm or leg.  On initial NIH stroke scale at the bridge she did have a left bobbing/drift of the leg.  CT of head was negative.    Date last known well: Date: 12/19/2017 Time last known well: Time: 09:40 tPA Given: No: Minimal symptoms NIH stroke scale of 2 Modified Rankin: Rankin Score=0   Past Medical History:  Diagnosis Date  . Anemia   . Hypertension   . Menorrhagia   . Obesity   . Syncope     Past Surgical History:  Procedure Laterality Date  . BREAST SURGERY    . TUBAL LIGATION      Family History  Problem Relation Age of Onset  . Hypertension Mother   . Hyperlipidemia Mother   . Diabetes Father   . Alcohol abuse Father   . Lupus Sister    Social History:  reports that she has been smoking cigarettes.  She has been smoking about 0.25 packs per day. she has never used smokeless tobacco. She reports that she drinks alcohol. She reports that she uses drugs. Drug: Marijuana.  Allergies: No Known Allergies  Medications:                                                                                                                           No current facility-administered medications for this encounter.    Current Outpatient Medications  Medication Sig Dispense Refill  . acetaminophen  (TYLENOL) 325 MG tablet Take 2 tablets (650 mg total) by mouth every 6 (six) hours as needed for mild pain (or Fever >/=  101). 30 tablet 0  . ferrous sulfate 325 (65 FE) MG tablet Take 1 tablet (325 mg total) by mouth daily with breakfast. 30 tablet 3  . HYDROcodone-acetaminophen (NORCO/VICODIN) 5-325 MG tablet Take 2 tablets by mouth every 4 (four) hours as needed. 10 tablet 0  . lisinopril-hydrochlorothiazide (PRINZIDE,ZESTORETIC) 20-25 MG per tablet Take 1 tablet by mouth daily. 30 tablet 11  . oxyCODONE (ROXICODONE) 5 MG immediate release tablet Take 1 tablet (5 mg total) by mouth 3 (three) times daily as needed for severe pain or breakthrough pain. 10 tablet 0  . pyridOXINE (VITAMIN B-6) 100 MG tablet Take 100 mg by mouth daily.    . rivaroxaban (XARELTO) 20 MG TABS tablet Take 1 tablet (20 mg total) by mouth daily with supper. 30 tablet 3  . traMADol (ULTRAM) 50 MG tablet Take 1-2 tablets (50-100 mg total) by mouth every 6 (six) hours as needed. 12 tablet 0  . vitamin C (ASCORBIC ACID) 500 MG tablet Take 500 mg by mouth daily.       ROS:                                                                                                                                       History obtained from the patient  General ROS: negative for - chills, fatigue, fever, night sweats, weight gain or weight loss Psychological ROS: negative for - , hallucinations, memory difficulties, mood swings or  Ophthalmic ROS: negative for - blurry vision, double vision, eye pain or loss of vision ENT ROS: Positive for -dizziness Respiratory ROS: negative for - cough,  shortness of breath or wheezing Cardiovascular ROS: negative for - chest pain, dyspnea on exertion,  Gastrointestinal ROS: negative for - abdominal pain, diarrhea,  nausea/vomiting or stool incontinence Genito-Urinary ROS: negative for - dysuria, hematuria, incontinence or urinary frequency/urgency Musculoskeletal ROS: negative for - joint swelling or  muscular weakness Neurological ROS: as noted in HPI   General Examination:                                                                                                      Blood pressure (!) 188/140, pulse 76, temperature 98.1 F (36.7 C), temperature source Oral, resp. rate 18, SpO2 100 %.  HEENT-  Normocephalic, no lesions, without obvious abnormality.  Normal external eye and conjunctiva.   Cardiovascular- S1-S2 audible, pulses palpable throughout   Lungs-no rhonchi or wheezing noted, no excessive working breathing.  Saturations within normal limits Abdomen- All 4  quadrants palpated and nontender Extremities- Warm, dry and intact Musculoskeletal-no joint tenderness, deformity or swelling Skin-warm and dry, no hyperpigmentation, vitiligo, or suspicious lesions  Neurological Examination Mental Status: Alert, oriented, thought content appropriate.  Speech fluent without evidence of aphasia.  Able to follow 3 step commands without difficulty. Cranial Nerves: II:  Visual fields grossly normal,  III,IV, VI: ptosis not present, extra-ocular motions intact bilaterally, pupils equal, round, reactive to light and accommodation V,VII: smile symmetric, facial light touch decreased on the left VIII: hearing normal bilaterally IX,X: uvula rises symmetrically XI: bilateral shoulder shrug XII: midline tongue extension Motor: Right : Upper extremity   5/5    Left:     Upper extremity   5/5  Lower extremity   5/5     Lower extremity   5/5 Bobbing drift on the left leg  Tone and bulk:normal tone throughout; no atrophy noted Sensory: Pinprick and light touch intact throughout, bilaterally Deep Tendon Reflexes: 2+ and symmetric throughout Plantars: Right: downgoing   Left: downgoing Cerebellar: normal finger-to-nose,  and normal heel-to-shin test Gait: Not tested   Lab Results: Basic Metabolic Panel: Recent Labs  Lab 12/19/17 1115  NA 141  K 3.4*  CL 105  GLUCOSE 101*  BUN <3*   CREATININE 0.60    CBC: Recent Labs  Lab 12/19/17 1103 12/19/17 1115  WBC 9.4  --   NEUTROABS PENDING  --   HGB 9.6* 11.6*  HCT 30.8* 34.0*  MCV 63.8*  --   PLT 389  --     Lipid Panel: No results for input(s): CHOL, TRIG, HDL, CHOLHDL, VLDL, LDLCALC in the last 168 hours.  CBG: No results for input(s): GLUCAP in the last 168 hours.  Imaging: Ct Head Code Stroke Wo Contrast  Result Date: 12/19/2017 CLINICAL DATA:  Code stroke. LEFT-sided numbness. Chronic headache. EXAM: CT HEAD WITHOUT CONTRAST TECHNIQUE: Contiguous axial images were obtained from the base of the skull through the vertex without intravenous contrast. COMPARISON:  09/15/2005. FINDINGS: Brain: No evidence of acute infarction, hemorrhage, hydrocephalus, extra-axial collection or mass lesion/mass effect. Vascular: No hyperdense vessel or unexpected calcification. Skull: Normal. Negative for fracture or focal lesion. Sinuses/Orbits: No acute finding. Other: None. ASPECTS [facility]([place] Stroke Program Early CT Score) - Ganglionic level infarction (caudate, lentiform nuclei, internal capsule, insula, M1-M3 cortex): 7 - Supraganglionic infarction (M4-M6 cortex): 3 Total score (0-10 with 10 being normal): 10 IMPRESSION: 1. Negative exam 2. ASPECTS is 10 These results were communicated to Dr. Laurence SlateAroor at 11:33 amon 1/30/2019by text page via the [facility] messaging system. Electronically Signed   By: Elsie StainJohn T Curnes M.D.   On: 12/19/2017 11:34    Assessment and plan discussed with with attending physician and they are in agreement.    Felicie MornDavid Smith PA-C [facility] [phone]  12/19/2017, 11:38 AM   Assessment: 42 y.o. female presenting to the hospital with left face, left arm decreased sensation associated with headache and dizziness.  CT of head negative for any acute intracranial pathology.  Given her risk factors of hypertension in addition that she is on Xarelto for past pulmonary embolism patient would benefit from MRI  and stroke workup.  Stroke Risk Factors - hypertension  Recommend 1. HgbA1c, fasting lipid panel 2. MRI/MRA of the brain without contrast 3. PT consult, OT consult, Speech consult 4. Echocardiogram 5. 80 mg of Atorvistatin 6. Prophylactic therapy-Antiplatelet med: Tinea Xarelto 7. Risk factor modification 8. Telemetry monitoring 9. Frequent neuro checks 10 NPO until passes stroke swallow screen 11 please page  stroke NP  Or  PA  Or MD from 8am -4 pm  as this patient from this time will be  followed by the stroke.   You can look them up on [email or web address]  Password [email or web address]   NEUROHOSPITALIST ADDENDUM Seen and examined the patient today. Formulated plan as documented above by PAC/Resident. I agree with recommendations as above. Will follow.  Georgiana Spinner Aroor MD [facility] [phone]  If 7pm to 7am, please call on call as listed on AMION.

## 2017-12-19 NOTE — ED Provider Notes (Signed)
[facility] EMERGENCY DEPARTMENT Provider Note   CSN: [id number] Arrival date & time: 12/19/17  1057     History   Chief Complaint Chief Complaint  Patient presents with  . Weakness    HPI Melissa Krueger is a 42 y.o. female.  Patient is a 42 year old female with a history of PE and hypertension.  She is on Xarelto.  She presents with sudden onset of left-sided weakness.  This started about 9:30 AM this morning.  She was at work and noticed that her left arm and left leg felt weak.  Per EMS, she was having to drag her left foot slightly on ambulation.  She also reports numbness in her left arm and left leg.  She denies any recent head trauma.  No difficulty with her speech or vision.  No history of similar symptoms in the past.  Her symptoms have been constant since they started prior to arrival.      Past Medical History:  Diagnosis Date  . Anemia   . Hypertension   . Menorrhagia   . Obesity   . Pulmonary embolism (HCC)   . Syncope     Patient Active Problem List   Diagnosis Date Noted  . Hyponatremia 04/04/2015  . Pulmonary embolism (HCC) 04/04/2015  . Chest pain   . Hypokalemia   . Acute pulmonary embolism (HCC)   . Pulmonary embolus (HCC) 04/03/2015  . Murmur 07/08/2014  . Menorrhagia 06/16/2014  . Healthcare maintenance 06/16/2014  . Syncope 05/25/2014  . Well woman exam 10/05/2011  . Obesity 10/05/2011  . TOBACCO USER 08/28/2009  . Iron deficiency anemia 01/17/2007  . HYPERTENSION, BENIGN SYSTEMIC 01/17/2007    Past Surgical History:  Procedure Laterality Date  . BREAST SURGERY    . TUBAL LIGATION      OB History    No data available       Home Medications    Prior to Admission medications   Medication Sig Start Date End Date Taking? Authorizing Provider  acetaminophen (TYLENOL) 325 MG tablet Take 2 tablets (650 mg total) by mouth every 6 (six) hours as needed for mild pain (or Fever >/= 101). 04/04/15   Alison Murray,  MD  ferrous sulfate 325 (65 FE) MG tablet Take 1 tablet (325 mg total) by mouth daily with breakfast. 05/29/14   Elenora Gamma, MD  HYDROcodone-acetaminophen (NORCO/VICODIN) 5-325 MG tablet Take 2 tablets by mouth every 4 (four) hours as needed. 02/24/16   Earley Favor, NP  lisinopril-hydrochlorothiazide (PRINZIDE,ZESTORETIC) 20-25 MG per tablet Take 1 tablet by mouth daily. 04/16/15   Elenora Gamma, MD  oxyCODONE (ROXICODONE) 5 MG immediate release tablet Take 1 tablet (5 mg total) by mouth 3 (three) times daily as needed for severe pain or breakthrough pain. 04/11/15   Tomasita Crumble, MD  pyridOXINE (VITAMIN B-6) 100 MG tablet Take 100 mg by mouth daily.    [provider]  rivaroxaban (XARELTO) 20 MG TABS tablet Take 1 tablet (20 mg total) by mouth daily with supper. 04/28/15   Elenora Gamma, MD  traMADol (ULTRAM) 50 MG tablet Take 1-2 tablets (50-100 mg total) by mouth every 6 (six) hours as needed. 02/24/16   Earley Favor, NP  vitamin C (ASCORBIC ACID) 500 MG tablet Take 500 mg by mouth daily.    [provider]    Family History Family History  Problem Relation Age of Onset  . Hypertension Mother   . Hyperlipidemia Mother   . Diabetes Father   .  Alcohol abuse Father   . Lupus Sister     Social History Social History   Tobacco Use  . Smoking status: Current Every Day Smoker    Packs/day: 0.25    Types: Cigarettes  . Smokeless tobacco: Never Used  Substance Use Topics  . Alcohol use: Yes    Comment: occasional  . Drug use: Yes    Types: Marijuana     Allergies   Patient has no known allergies.   Review of Systems Review of Systems  Constitutional: Negative for chills, diaphoresis, fatigue and fever.  HENT: Negative for congestion, rhinorrhea and sneezing.   Eyes: Negative.   Respiratory: Negative for cough, chest tightness and shortness of breath.   Cardiovascular: Negative for chest pain and leg swelling.  Gastrointestinal: Negative for  abdominal pain, blood in stool, diarrhea, nausea and vomiting.  Genitourinary: Negative for difficulty urinating, flank pain, frequency and hematuria.  Musculoskeletal: Negative for arthralgias and back pain.  Skin: Negative for rash.  Neurological: Positive for weakness and numbness. Negative for dizziness, speech difficulty and headaches.     Physical Exam Updated Vital Signs BP (!) 188/140 (BP Location: Left Arm)   Pulse 76   Temp 98.1 F (36.7 C) (Oral)   Resp 18   SpO2 100%   Physical Exam  Constitutional: She is oriented to person, place, and time. She appears well-developed and well-nourished.  HENT:  Head: Normocephalic and atraumatic.  Eyes: Pupils are equal, round, and reactive to light.  Neck: Normal range of motion. Neck supple.  Cardiovascular: Normal rate, regular rhythm and normal heart sounds.  Pulmonary/Chest: Effort normal and breath sounds normal. No respiratory distress. She has no wheezes. She has no rales. She exhibits no tenderness.  Abdominal: Soft. Bowel sounds are normal. There is no tenderness. There is no rebound and no guarding.  Musculoskeletal: Normal range of motion. She exhibits no edema.  Lymphadenopathy:    She has no cervical adenopathy.  Neurological: She is alert and oriented to person, place, and time.  Patient has 4 out of 5 strength in the left upper and lower extremity as compared to 5 out of 5 on the right side.  She has some diminished sensation to light touch in the left upper and lower extremities.  No facial drooping.  Skin: Skin is warm and dry. No rash noted.  Psychiatric: She has a normal mood and affect.     ED Treatments / Results  Labs (all labs ordered are listed, but only abnormal results are displayed) Labs Reviewed  CBC - Abnormal; Notable for the following components:      Result Value   Hemoglobin 9.6 (*)    HCT 30.8 (*)    MCV 63.8 (*)    MCH 19.9 (*)    RDW 19.3 (*)    All other components within normal limits   I-STAT CHEM 8, ED - Abnormal; Notable for the following components:   Potassium 3.4 (*)    BUN <3 (*)    Glucose, Bld 101 (*)    Calcium, Ion 1.14 (*)    Hemoglobin 11.6 (*)    HCT 34.0 (*)    All other components within normal limits  PROTIME-INR  APTT  DIFFERENTIAL  COMPREHENSIVE METABOLIC PANEL  I-STAT TROPONIN, ED  I-STAT BETA HCG BLOOD, ED ([facility], [facility], [facility] ONLY)  CBG MONITORING, ED    EKG  EKG Interpretation None       Radiology Ct Head Code Stroke Wo Contrast  Result Date: 12/19/2017  CLINICAL DATA:  Code stroke. LEFT-sided numbness. Chronic headache. EXAM: CT HEAD WITHOUT CONTRAST TECHNIQUE: Contiguous axial images were obtained from the base of the skull through the vertex without intravenous contrast. COMPARISON:  09/15/2005. FINDINGS: Brain: No evidence of acute infarction, hemorrhage, hydrocephalus, extra-axial collection or mass lesion/mass effect. Vascular: No hyperdense vessel or unexpected calcification. Skull: Normal. Negative for fracture or focal lesion. Sinuses/Orbits: No acute finding. Other: None. ASPECTS [facility]([place] Stroke Program Early CT Score) - Ganglionic level infarction (caudate, lentiform nuclei, internal capsule, insula, M1-M3 cortex): 7 - Supraganglionic infarction (M4-M6 cortex): 3 Total score (0-10 with 10 being normal): 10 IMPRESSION: 1. Negative exam 2. ASPECTS is 10 These results were communicated to Dr. Laurence SlateAroor at 11:33 amon 1/30/2019by text page via the [facility] messaging system. Electronically Signed   By: Elsie StainJohn T Curnes M.D.   On: 12/19/2017 11:34    Procedures Procedures (including critical care time)  Medications Ordered in ED Medications - No data to display   Initial Impression / Assessment and Plan / ED Course  I have reviewed the triage vital signs and the nursing notes.  Pertinent labs & imaging results that were available during my care of the patient were reviewed by me and considered in my medical decision making (see chart for  details).     Code stroke was called on arrival given her weakness that started just prior to arrival.  CT scan does not show acute abnormality.  Neurology has seen the patient and recommends admission for further stroke workup given her other risk factors.  I spoke with the FP resident who will admit the pt. Final Clinical Impressions(s) / ED Diagnoses   Final diagnoses:  Left-sided weakness    ED Discharge Orders    None       Rolan BuccoBelfi, Raylan Troiani, MD 12/19/17 1216

## 2017-12-19 NOTE — H&P (Signed)
[facility] History and Physical Service Pager: [phone]  Patient name: Melissa Krueger Medical record number: [id number] Date of birth: 12/26/1975 Age: 42 y.o. Gender: female  Primary Care Provider: Berton BonMikell, Asiyah Zahra, MD Consultants: neurology Code Status: FULL  Chief Complaint: left sided weakness  Assessment and Plan: Melissa Krueger is a 42 y.o. female presenting with left-sided weakness and dizziness. PMH is significant for hypertension, history of pulmonary embolism, tobacco use, obesity  Left-sided weakness  Patient has uncontrolled hypertension, current tobacco use and morbid obesity presenting with left-sided weakness in the arm, leg and facial numbness most concerning for acute CVA.  CT head negative.  Denying chest pain, shortness of breath, nausea, vomiting or diarrhea.  Reports good p.o. intake over the past couple days.  Exam significant for numbness in the V2 distribution of the facial nerve on the left side, grip strength 4/5 in the left hand, left lower extremity strength 4/5.  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  Was discharged with Xarelto and reportedly only finished 1 week of the medication.  She has not followed up with family practice since May 2016.  Denies taking any blood pressure medication. Code stroke called in the ED and patient was evaluated by neurology who recommends admission for further workup.   -Admit to family teaching service under attending [place] -ASA 81mg  -Neurology following appreciate recommendations -MRI/MRA brain -Carotid ultrasound -Echocardiogram -Check lipid panel, start atorvastatin 80 mg -Check A1c -Permissive hypertension -Neurochecks q2hrs -Tobacco cessation -Cardiac monitoring -N.p.o. until passes stroke swallow -PT/OT evaluation  History of PE Back in 2016. Discharged on Xarelto and only completed 1 week of the medication had  one follow-up appointment with PCP.  No further episodes episodes of chest pain, shortness of breath, lower extremity edema, redness or pain.  Current smoker and has untreated hypertension. EKG is regular rate and sinus rhythm.  No need for any coagulation at this time. -Start aspirin 81 mg -Continue to monitor on telemetry  History of hypertension Document a history of hypertension and was prescribed lisinopril hydrochlorothiazide 20-25 mg daily.  Review medication expired 04/15/2016.  She is unable to give me an exact date but I would imagine she has not taken this for at least a year.  Currently hypertensive to 150s/100.  Endorsing frontal headache without red flag signs or symptoms.  -Permissive hypertension  -Can start lisinopril hydrochlorthiazide 20-25 mg after 24 hours -Monitor on telemetry  Tobacco abuse Smokes 6-7 cigarettes daily.  Discussed importance of quitting.  History of iron deficiency anemia Hemoglobin to 11.6. Will continue to monitor  FEN/GI: NPO until bedside swallow Prophylaxis: lovenox  Disposition: admit to telemetry   History of Present Illness:  Melissa Krueger is a 42 y.o. female with history of pulmonary embolism, tobacco use, hypertension and obesity who was last known well this morning when she woke up for work. She rode the bus to work and was feeling okay and started with some left leg and arm weakness and left facial numbness while walking to the break room around 9:40 AM.  Was able to sit down medially after the onset of the symptoms.  Denied any chest pain or shortness of breath during this time. Did describe frontal headache during this time rated about 6/10 and this is currently continuing.   Was transported to the emergency department via EMS.  Was evaluated in the emergency department and code stroke was called.  Was seen by neurology  in the emergency department.  Had CT head that was negative.  Call for admission for stroke workup.  Patient will be  admitted to family medicine teaching service for further evaluation.  Review Of Systems: Per HPI with the following additions:   ROS  Patient Active Problem List   Diagnosis Date Noted  . Hyponatremia 04/04/2015  . Pulmonary embolism (HCC) 04/04/2015  . Chest pain   . Hypokalemia   . Acute pulmonary embolism (HCC)   . Pulmonary embolus (HCC) 04/03/2015  . Murmur 07/08/2014  . Menorrhagia 06/16/2014  . Healthcare maintenance 06/16/2014  . Syncope 05/25/2014  . Well woman exam 10/05/2011  . Obesity 10/05/2011  . TOBACCO USER 08/28/2009  . Iron deficiency anemia 01/17/2007  . HYPERTENSION, BENIGN SYSTEMIC 01/17/2007    Past Medical History: Past Medical History:  Diagnosis Date  . Anemia   . Hypertension   . Menorrhagia   . Obesity   . Pulmonary embolism (HCC)   . Syncope     Past Surgical History: Past Surgical History:  Procedure Laterality Date  . BREAST SURGERY    . TUBAL LIGATION      Social History: Social History   Tobacco Use  . Smoking status: Current Every Day Smoker    Packs/day: 0.25    Types: Cigarettes  . Smokeless tobacco: Never Used  Substance Use Topics  . Alcohol use: Yes    Comment: occasional  . Drug use: Yes    Types: Marijuana    Please also refer to relevant sections of EMR.  Family History: Family History  Problem Relation Age of Onset  . Hypertension Mother   . Hyperlipidemia Mother   . Diabetes Father   . Alcohol abuse Father   . Lupus Sister     Allergies and Medications: No Known Allergies No current facility-administered medications on file prior to encounter.    Current Outpatient Medications on File Prior to Encounter  Medication Sig Dispense Refill  . acetaminophen (TYLENOL) 325 MG tablet Take 2 tablets (650 mg total) by mouth every 6 (six) hours as needed for mild pain (or Fever >/= 101). (Patient not taking: Reported on 12/19/2017) 30 tablet 0  . ferrous sulfate 325 (65 FE) MG tablet Take 1 tablet (325 mg total)  by mouth daily with breakfast. (Patient not taking: Reported on 12/19/2017) 30 tablet 3  . HYDROcodone-acetaminophen (NORCO/VICODIN) 5-325 MG tablet Take 2 tablets by mouth every 4 (four) hours as needed. (Patient not taking: Reported on 12/19/2017) 10 tablet 0  . lisinopril-hydrochlorothiazide (PRINZIDE,ZESTORETIC) 20-25 MG per tablet Take 1 tablet by mouth daily. (Patient not taking: Reported on 12/19/2017) 30 tablet 11  . oxyCODONE (ROXICODONE) 5 MG immediate release tablet Take 1 tablet (5 mg total) by mouth 3 (three) times daily as needed for severe pain or breakthrough pain. (Patient not taking: Reported on 12/19/2017) 10 tablet 0  . rivaroxaban (XARELTO) 20 MG TABS tablet Take 1 tablet (20 mg total) by mouth daily with supper. (Patient not taking: Reported on 12/19/2017) 30 tablet 3  . traMADol (ULTRAM) 50 MG tablet Take 1-2 tablets (50-100 mg total) by mouth every 6 (six) hours as needed. (Patient not taking: Reported on 12/19/2017) 12 tablet 0  . vitamin C (ASCORBIC ACID) 500 MG tablet Take 500 mg by mouth daily.      Objective: BP (!) 163/127 (BP Location: Left Arm)   Pulse 69   Temp 98.2 F (36.8 C)   Resp 18   SpO2 100%  Exam: General: 42 year old female  sitting up in bed in no acute distress Eyes: EOMI, PERRL, no scleral icterus ENTM: No nasal drainage, oropharynx clear, moist mucous membranes Neck: Supple, no JVD Cardiovascular: Regular rate and rhythm, no apparent murmurs, rubs or gallops, edema Respiratory: No increased work of breathing, clear all station bilaterally, no wheezing or rhonchi Gastrointestinal: Soft, nontender no distended, no organomegaly MSK: No gross deformities or edema Derm: Skin warm and dry Neuro: No focal neurological deficits, CN 2-12 WNL, grip strength 4 out of 5 in left hand, strength 4 out of 5 left lower extremity otherwise 5 out of 5 throughout.  No dysarthria.  Psych: Normal mood and affect  Labs and Imaging: CBC BMET  Recent Labs  Lab  12/19/17 1103 12/19/17 1115  WBC 9.4  --   HGB 9.6* 11.6*  HCT 30.8* 34.0*  PLT 389  --    Recent Labs  Lab 12/19/17 1103 12/19/17 1115  NA 139 141  K 3.5 3.4*  CL 108 105  CO2 21*  --   BUN <5* <3*  CREATININE 0.66 0.60  GLUCOSE 101* 101*  CALCIUM 8.6*  --       Renne Musca, MD 12/19/2017, 12:19 PM PGY-2, [facility] FPTS Intern pager: [phone], text pages welcome

## 2017-12-19 NOTE — ED Triage Notes (Signed)
Pt reports that she had mild right sided weakness. Reports pins and needles.

## 2017-12-20 ENCOUNTER — Other Ambulatory Visit (HOSPITAL_COMMUNITY): Payer: Medicaid Other

## 2017-12-20 ENCOUNTER — Encounter (HOSPITAL_COMMUNITY): Payer: Self-pay | Admitting: *Deleted

## 2017-12-20 ENCOUNTER — Inpatient Hospital Stay (HOSPITAL_COMMUNITY): Payer: Self-pay

## 2017-12-20 ENCOUNTER — Encounter (HOSPITAL_COMMUNITY): Payer: Medicaid Other

## 2017-12-20 ENCOUNTER — Inpatient Hospital Stay (HOSPITAL_COMMUNITY): Payer: Medicaid Other

## 2017-12-20 DIAGNOSIS — I63431 Cerebral infarction due to embolism of right posterior cerebral artery: Secondary | ICD-10-CM

## 2017-12-20 DIAGNOSIS — F1210 Cannabis abuse, uncomplicated: Secondary | ICD-10-CM

## 2017-12-20 DIAGNOSIS — I7774 Dissection of vertebral artery: Secondary | ICD-10-CM

## 2017-12-20 DIAGNOSIS — Z86711 Personal history of pulmonary embolism: Secondary | ICD-10-CM

## 2017-12-20 DIAGNOSIS — R2990 Unspecified symptoms and signs involving the nervous system: Secondary | ICD-10-CM

## 2017-12-20 DIAGNOSIS — I361 Nonrheumatic tricuspid (valve) insufficiency: Secondary | ICD-10-CM

## 2017-12-20 DIAGNOSIS — F17200 Nicotine dependence, unspecified, uncomplicated: Secondary | ICD-10-CM

## 2017-12-20 DIAGNOSIS — I6340 Cerebral infarction due to embolism of unspecified cerebral artery: Secondary | ICD-10-CM

## 2017-12-20 DIAGNOSIS — R531 Weakness: Secondary | ICD-10-CM

## 2017-12-20 LAB — LIPID PANEL
CHOL/HDL RATIO: 5.1 ratio
Cholesterol: 178 mg/dL (ref 0–200)
HDL: 35 mg/dL — AB (ref >40)
LDL CALC: 121 mg/dL — AB (ref 0–99)
Triglycerides: 109 mg/dL (ref <150)
VLDL: 22 mg/dL (ref 0–40)

## 2017-12-20 LAB — RAPID URINE DRUG SCREEN, HOSP PERFORMED
Amphetamines: NOT DETECTED
Barbiturates: NOT DETECTED
Benzodiazepines: NOT DETECTED
Cocaine: NOT DETECTED
Opiates: NOT DETECTED
TETRAHYDROCANNABINOL: POSITIVE — AB

## 2017-12-20 LAB — ECHOCARDIOGRAM COMPLETE

## 2017-12-20 LAB — HEMOGLOBIN A1C
Hgb A1c MFr Bld: 5.9 % — ABNORMAL HIGH (ref 4.8–5.6)
MEAN PLASMA GLUCOSE: 122.63 mg/dL

## 2017-12-20 LAB — HIV ANTIBODY (ROUTINE TESTING W REFLEX): HIV SCREEN 4TH GENERATION: NONREACTIVE

## 2017-12-20 MED ORDER — IOPAMIDOL (ISOVUE-370) INJECTION 76%
INTRAVENOUS | Status: AC
  Administered 2017-12-20: 50 mL
  Filled 2017-12-20: qty 100

## 2017-12-20 MED ORDER — CLOPIDOGREL BISULFATE 75 MG PO TABS
75.0000 mg | ORAL_TABLET | Freq: Every day | ORAL | Status: DC
  Administered 2017-12-20 – 2017-12-21 (×2): 75 mg via ORAL
  Filled 2017-12-20 (×2): qty 1

## 2017-12-20 MED ORDER — ASPIRIN EC 325 MG PO TBEC
325.0000 mg | DELAYED_RELEASE_TABLET | Freq: Every day | ORAL | Status: DC
  Administered 2017-12-21: 325 mg via ORAL
  Filled 2017-12-20: qty 1

## 2017-12-20 NOTE — Progress Notes (Signed)
SLP Cancellation Note  Patient Details Name: Melissa LenzJosephine Krueger MRN: [id number] DOB: 01/15/1976   Cancelled treatment:       Reason Eval/Treat Not Completed: Patient at procedure or test/unavailable.  Attempted to see pt for cognitive-linguistic evaluation.  Pt off unit for procedure.  Will re-attempt assessment as pt is available.   Darrien Laakso, Melanee SpryNicole L 12/20/2017, 11:24 AM

## 2017-12-20 NOTE — Progress Notes (Addendum)
NEUROHOSPITALISTS STROKE TEAM - DAILY PROGRESS NOTE   ADMISSION HISTORY: Melissa Krueger is an 42 y.o. female works at [employer] and noted that at approximately 940 she was going back to the break room and her left face and left arm had decreased sensation along with she had a constant headache.  She then noted that she was dizzy, and when asked about the dizziness it was not vertigo but just was a headed.  Currently she continues to have a headache but only has decreased sensation on the left face but not the arm or leg.  On initial NIH stroke scale at the bridge she did have a left bobbing/drift of the leg.  CT of head was negative.    Date last known well: Date: 12/19/2017 Time last known well: Time: 09:40 tPA Given: No: Minimal symptoms NIH stroke scale of 2 Modified Rankin: Rankin Score=0  SUBJECTIVE (INTERVAL HISTORY)  Daughter is at the bedside. Patient is found laying in bed in NAD. Overall she feels her condition is unchanged. Voices no new complaints. No new events reported overnight. Continues to complain of H/A and mild dizziness. No nausea reported.  States that she stopped her Xarelto because of all the commercials on TV and she was worried about the bleeding side effects. She had a DVT in her Right leg in 2014/2015, it then went onto a PE. She took Xarelto for about one year and then stopped. Mother and sister have history of Lupus. She has never been tested.   OBJECTIVE Lab Results: CBC:  Recent Labs  Lab 12/19/17 1103 12/19/17 1115 12/19/17 1400  WBC 9.4  --  8.8  HGB 9.6* 11.6* 9.6*  HCT 30.8* 34.0* 30.9*  MCV 63.8*  --  63.8*  PLT 389  --  420*   BMP: Recent Labs  Lab 12/19/17 1103 12/19/17 1115 12/19/17 1400  NA 139 141  --   K 3.5 3.4*  --   CL 108 105  --   CO2 21*  --   --   GLUCOSE 101* 101*  --   BUN <5* <3*  --   CREATININE 0.66 0.60 0.64  CALCIUM 8.6*  --   --    Liver Function Tests:    Recent Labs  Lab 12/19/17 1103  AST 24  ALT 14  ALKPHOS 87  BILITOT 0.4  PROT 6.7  ALBUMIN 3.5   Thyroid Function Studies:  Recent Labs    12/19/17 1401  TSH 0.597   Coagulation Studies:  Recent Labs    12/19/17 1103  APTT 24  INR 0.96   PHYSICAL EXAM Temp:  [98.3 F (36.8 C)-98.6 F (37 C)] 98.6 F (37 C) (01/31 0945) Pulse Rate:  [65-78] 65 (01/31 0945) Resp:  [18-20] 18 (01/31 0945) BP: (122-158)/(70-95) 158/92 (01/31 0945) SpO2:  [98 %-100 %] 98 % (01/31 0945) General - Well nourished, well developed, in no apparent distress HEENT-  Normocephalic,  Cardiovascular - Regular rate and rhythm  Respiratory - Lungs clear bilaterally. No wheezing. Abdomen - soft and non-tender, BS normal Extremities- no edema or cyanosis  Neurological Examination Mental Status: Alert, oriented, thought content appropriate.  Speech fluent without evidence of aphasia.  Able to follow 3 step commands without difficulty. Cranial Nerves: II:  Visual fields grossly normal,  III,IV, VI: ptosis not present, extra-ocular motions intact bilaterally, pupils equal, round, reactive to light and accommodation V,VII: smile symmetric, facial light touch decreased on the left VIII: hearing normal bilaterally IX,X: uvula rises symmetrically XI:  bilateral shoulder shrug XII: midline tongue extension Motor: Right : Upper extremity   5/5    Left:     Upper extremity   5/5  Lower extremity   5/5     Lower extremity   5/5 Bobbing drift on the left leg  Tone and bulk:normal tone throughout; no atrophy noted Sensory: Pinprick and light touch intact throughout, bilaterally Deep Tendon Reflexes: 2+ and symmetric throughout Plantars: Right: downgoing   Left: downgoing Cerebellar: normal finger-to-nose,  and normal heel-to-shin test Gait: Not tested  IMAGING: I have personally reviewed the radiological images below and agree with the radiology interpretations.  Ct Angio Head/ Neck W Or Wo  Contrast Addendum Date: 12/20/2017   IMPRESSION: 1. 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 Flame shaped termination of normal enhancement at the vessel origin. Intermittent, poor reconstituted enhancement to the vertebrobasilar junction. 2. 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 3. Minimal atherosclerosis at only the left subclavian and left ICA origins. Normal left vertebral artery. 4. The small right thalamic infarct is largely occult on CT, stable CT appearance of the brain. Electronically Signed: By: Odessa Fleming M.D. On: 12/20/2017 12:03   Mr Brain Wo Contrast Mr Maxine Glenn Head Wo Contrast Result Date: 12/20/2017  IMPRESSION: 1. Acute lacunar infarct in the right thalamus. 2. Absent or abnormal flow in the right vertebral artery beginning in the neck. Dissection is a consideration, please correlate for neck pain. 3. Distal right P2 high-grade narrowing. Given the other medium size vessels are normal-appearing, this may be embolic disease. Electronically Signed   By: Marnee Spring M.D.   On: 12/20/2017 08:14   Ct Head Code Stroke Wo Contrast Result Date: 12/19/2017  IMPRESSION: 1. Negative exam 2. ASPECTS is 10 These results were communicated to Dr. Laurence Slate at 11:33 amon 1/30/2019by text page via the [facility] messaging system. Electronically Signed   By: Elsie Stain M.D.   On: 12/19/2017 11:34   Echocardiogram:                                               Study Conclusions - Left ventricle: Wall thickness was increased in a pattern of   moderate LVH. Systolic function was vigorous. The estimated   ejection fraction was in the range of 65% to 70%. Left   ventricular diastolic function parameters were normal. - Aortic valve: There was mild regurgitation. Valve area (VTI):   2.48 cm^2. Valve area (Vmax): 2.33 cm^2. Valve  area (Vmean): 2.31   cm^2. - Atrial septum: No defect or patent foramen ovale was identified.  B/L LE Duplex:                                                   Negative for deep and superficial vein thrombosis in both legs.  TCD:  Final Interpretation: A vascular evaluation was performed. The left middle cerebral artery was studied. An IV was inserted into the patient's right forearm. Verbal informed consent was obtained. Dr. Roda Shutters performed the study. No HITS at rest or during Valsalva. No apparent PFO. Negative transcranial Doppler bubble study with no evidence of right to left intracardiac communication.     IMPRESSION: Ms. Tanesia Butner is a 42 y.o. female with PMH of HTN, Anemia and Obesity presenting to the hospital with left face, left arm decreased sensation associated with headache and dizziness. 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  Suspected Etiology: unknown at this time Resultant Symptoms: Left sided deficits Stroke Risk Factors: diabetes mellitus, hyperlipidemia and hypertension Other Stroke Risk Factors: Cigarette smoker, Polysubstance Abuse, Obesity,  Outstanding Stroke Work-up Studies:     Hypercoagulable Labs                                          PENDING  PLAN  12/20/2017: Continue Aspirin/ Plavix/Statin Frequent neuro checks Telemetry monitoring PT/OT/SLP Consult PM & Rehab Consult Case Management /MSW Ongoing aggressive stroke risk factor management Patient counseled to be compliant with her antithrombotic medications Patient counseled on Lifestyle modifications including, Diet, Exercise, and Stress Follow up with [facility] in 6 weeks  Hx of PE and DVT Will likely need AC therapy at discharge Will likely need medication assistance program to afford medication  HYPERTENSION: Stable SBP goal of <  180. DBP goal of < 105.  Labetolol PRN Long term BP goal normotensive. May slowly restart home B/P medications after 48 hours Home Meds: Lisinopril-HCTZ  HYPERLIPIDEMIA:    Component Value Date/Time   CHOL 178 12/20/2017 0247   TRIG 109 12/20/2017 0247   HDL 35 (L) 12/20/2017 0247   CHOLHDL 5.1 12/20/2017 0247   VLDL 22 12/20/2017 0247   LDLCALC 121 (H) 12/20/2017 0247  Home Meds:  NONE LDL  goal < 70 Started on Lipitor to 80 mg daily Continue statin at discharge  PRE- DIABETES: Lab Results  Component Value Date   HGBA1C 5.9 (H) 12/20/2017  HgbA1c goal < 7.0 Continue CBG monitoring and SSI to maintain glucose 140-180 mg/dl DM education   TOBACCO ABUSE & POLYSUBSTANCE ABUSE Current smoker Smoking cessation counseling provided Nicotine patch provided  OBESITY Obesity, There is no height or weight on file to calculate BMI. Greater than/equal to 30  Other Active Problems: Active Problems:   Left-sided weakness   Cerebral embolism with cerebral infarction    Hospital day # 1 VTE prophylaxis: Lovenox  Diet : Fall precautions Diet Heart Room service appropriate? Yes; Fluid consistency: Thin   FAMILY UPDATES:  family at bedside  TEAM UPDATES: Carney Living, MD   Prior Home Stroke Medications:  No antithrombotic  Discharge Stroke Meds:  Please discharge patient on TBD   Disposition: 01-Home or Self Care Therapy Recs:               PENDING Follow Up:  Follow-up Information    Marvel Plan, MD. Schedule an appointment as soon as possible for a visit in 6 week(s).   Specialty:  Neurology Contact information: [address] [place] [address] [phone]          Berton Bon, MD -PCP Follow up in 1-2 weeks   Assessment & plan  discussed with with attending physician and they are in agreement.    Brita RompMary A Costello, ANP-C Stroke Neurology Team 12/20/2017 5:38 PM  ATTENDING NOTE: I reviewed above note and agree with the  assessment and plan. I have made any additions or clarifications directly to the above note. Pt was seen and examined.   42 year old female with history of hypertension, obesity, smoker, PE not compliant with Xarelto admitted for left face and arm decreased sensation, headache, dizziness.  CT negative.  MRI showed right thalamic infarct.  MRA showed right VA occlusion and right distal P2 stenosis.  CTA head and neck concerning for right VA dissection with occlusion, right P2 and P3 high-grade stenosis concerning for artery to artery embolism.  Patient denies head trauma, neck trauma but stated that she had intermittent neck pain for last 1 month.  EF 65-70%.  LE venous Doppler negative.  CT bubble study negative for PFO.  A1c 5.9 and LDL 121.  UDS positive for THC.  Patient had history of PE in 03/2015, was prescribed Xarelto.  However patient not compliant with medication and did not complete course of treatment.  She does not know the cause of PE, and DVT at that time negative.  Hypercoagulable workup at that time was also negative.   Do not feel patient need to restart anticoagulation at this time.  Put her on dual antiplatelet for 3 months and then Plavix alone due to intracranial stenosis and vertebral dissection.  Continue Lipitor 80. Will check sickle cell screening and ANA. Patient educated on quit smoking and THC, and lose weight. PT recommend home health PT.  Neurology will sign off. Please call with questions. [facility]. Thanks for the consult.  Marvel PlanJindong Arriana Lohmann, MD PhD Stroke Neurology 12/20/2017 7:57 PM     To contact Stroke Continuity provider, please refer to [email or web address]. After hours, contact General Neurology

## 2017-12-20 NOTE — Progress Notes (Signed)
OT Cancellation Note  Patient Details Name: Leisa LenzJosephine Brecheen MRN: [id number] DOB: 10/02/1976   Cancelled Treatment:    Reason Eval/Treat Not Completed: Patient at procedure or test/ unavailable.  Gaye AlkenBailey A Kenichi Cassada M.S., OTR/L Pager: [phone]  12/20/2017, 11:29 AM

## 2017-12-20 NOTE — Evaluation (Signed)
Speech Language Pathology Evaluation Patient Details Name: Melissa Krueger MRN: [id number] DOB: 10/03/1976 Today's Date: 12/20/2017 Time: [id number] SLP Time Calculation (min) (ACUTE ONLY): 15 min  Problem List:  Patient Active Problem List   Diagnosis Date Noted  . Cerebral embolism with cerebral infarction 12/20/2017  . Left-sided weakness 12/19/2017  . Hyponatremia 04/04/2015  . Pulmonary embolism (HCC) 04/04/2015  . Chest pain   . Hypokalemia   . Acute pulmonary embolism (HCC)   . Pulmonary embolus (HCC) 04/03/2015  . Murmur 07/08/2014  . Menorrhagia 06/16/2014  . Healthcare maintenance 06/16/2014  . Syncope 05/25/2014  . Well woman exam 10/05/2011  . Obesity 10/05/2011  . TOBACCO USER 08/28/2009  . Iron deficiency anemia 01/17/2007  . HYPERTENSION, BENIGN SYSTEMIC 01/17/2007   Past Medical History:  Past Medical History:  Diagnosis Date  . Anemia   . Daily headache   . DVT (deep venous thrombosis) (HCC) ~ 2014  . Heart murmur    "born w/one"  . Hypertension   . Menorrhagia   . Obesity   . Pulmonary embolism (HCC) ~ 2014  . Stroke (HCC) 12/19/2017   "they called it a light stroke; left face; LUE, LLE numb/weaker" (12/19/2017)  . Syncope    Past Surgical History:  Past Surgical History:  Procedure Laterality Date  . REDUCTION MAMMAPLASTY  1996  . TUBAL LIGATION  2007   HPI:   42 y.o. female presenting with left-sided weakness and dizziness. PMH is significant for hypertension, history of pulmonary embolism, tobacco use, obesity. CT head that was negative. MRI revealed Acute lacunar infarct in the right thalamus. as well as Absent or abnormal flow in the right vertebral artery beginning in the neck and Distal right P2 high-grade narrowing.   Assessment / Plan / Recommendation Clinical Impression   Pt presents with mild memory deficits.  Expressive and receptive language are grossly intact.  Speech is fluent and clear with no areas of focal weakness noted on  CN exam.  Given that pt was working and caring for her children prior to admission, would recommend [facility] follow up on discharge from hospital to address cognitive remediation/compensation and facilitate successful return to activities of daily living.      SLP Assessment  SLP Recommendation/Assessment: Patient needs continued Speech Lanaguage Pathology Services SLP Visit Diagnosis: Cognitive communication deficit (R41.841)    Follow Up Recommendations  Home health SLP    Frequency and Duration min 1 x/week         SLP Evaluation Cognition  Overall Cognitive Status: Impaired/Different from baseline Arousal/Alertness: Awake/alert Orientation Level: Oriented X4 Attention: Selective Memory: Impaired Memory Impairment: Retrieval deficit Awareness: Appears intact Problem Solving: Appears intact Safety/Judgment: Appears intact       Comprehension  Auditory Comprehension Overall Auditory Comprehension: Appears within functional limits for tasks assessed    Expression Expression Primary Mode of Expression: Verbal Verbal Expression Overall Verbal Expression: Appears within functional limits for tasks assessed Written Expression Dominant Hand: Right   Oral / Motor  Oral Motor/Sensory Function Overall Oral Motor/Sensory Function: Within functional limits Motor Speech Overall Motor Speech: Appears within functional limits for tasks assessed   GO                    Melissa HurterPage, Melissa Krueger 12/20/2017, 12:26 PM

## 2017-12-20 NOTE — Progress Notes (Signed)
Preliminary results by tech - Venous Duplex Lower Ext. Completed. Negative for deep and superficial vein thrombosis in both legs.  Rodnesha Elie, BS, RDMS, RVT  

## 2017-12-20 NOTE — Progress Notes (Signed)
Family Medicine Teaching Service Daily Progress Note Intern Pager: [phone]  Patient name: Melissa Krueger Medical record number: [id number] Date of birth: 05/09/1976 Age: 42 y.o. Gender: female  Primary Care Provider: Berton BonMikell, Asiyah Zahra, MD Consultants: neurology Code Status: full  Pt Overview and Major Events to Date:  1/30 admitted  Assessment and Plan:  Melissa Krueger is a 42 y.o. female presenting with left-sided weakness and dizziness. PMH is significant for hypertension, history of pulmonary embolism, tobacco use, obesity  Left-sided weakness  Patient has uncontrolled hypertension, current tobacco use and morbid obesity presenting with left-sided weakness in the arm, leg and facial numbness most concerning for acute CVA.  CT head negative.  Denying chest pain, shortness of breath, nausea, vomiting or diarrhea.  Reports good p.o. intake over the past couple days.  Exam significant for numbness in the V2 distribution of the facial nerve on the left side, grip strength 4/5 in the left hand, left lower extremity strength 4/5.  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  Was discharged with Xarelto and reportedly only finished 1 week of the medication.  She has not followed up with family practice since May 2016.  Denies taking any blood pressure medication. Code stroke called in the ED and patient was evaluated by neurology who recommends admission for further workup.  Lipids notable for LDL 121, HDL 35.  Hgb A1c 5.9.  MRI: acute lacunar infarct in right thalamus, absent or abnormal flow in the right vertebral artery beginning in neck, distal right P2 high-grade narrowing. -ASA 81mg  -Neurology following appreciate recommendations -Carotid ultrasound -Echocardiogram -continue atorvastatin 80 mg -Permissive hypertension -Neurochecks q2hrs -Tobacco cessation -Cardiac monitoring -PT/OT evaluation  History of PE Back in 2016.  Discharged on Xarelto and only completed 1 week of the medication had one follow-up appointment with PCP.  No further episodes episodes of chest pain, shortness of breath, lower extremity edema, redness or pain.  Current smoker and has untreated hypertension. EKG is regular rate and sinus rhythm.  No need for any coagulation at this time. -Continue aspirin 81 mg -Continue to monitor on telemetry - consider starting NOAC prior to discharge  History of hypertension Document a history of hypertension and was prescribed lisinopril hydrochlorothiazide 20-25 mg daily.  Review medication expired 04/15/2016.  She is unable to give me an exact date but I would imagine she has not taken this for at least a year.  Currently hypertensive to 150s/100.  Endorsing frontal headache without red flag signs or symptoms.  -Permissive hypertension  -start lisinopril hydrochlorthiazide 20-25 mg today -Monitor on telemetry  Tobacco abuse Smokes 6-7 cigarettes daily.  Discussed importance of quitting.  History of iron deficiency anemia Hemoglobin to 11.6. Will continue to monitor  FEN/GI: heart healthy Prophylaxis: lovenox   Disposition: likely home  Subjective:  Patient says she has a headache but has no other complaints.  Some subjective weakness when she walks.  Objective: Temp:  [98.1 F (36.7 C)-98.5 F (36.9 C)] 98.5 F (36.9 C) (01/31 0543) Pulse Rate:  [60-84] 67 (01/31 0543) Resp:  [16-22] 18 (01/31 0543) BP: (122-188)/(70-140) 122/70 (01/31 0543) SpO2:  [98 %-100 %] 100 % (01/31 0543) Physical Exam: General: lying in bed, in NAD Cardiovascular: RRR, no MRG Respiratory: CTAB Abdomen: soft, nontender Extremities: full ROM Neuro: patient with no asymmetry, normal gait, grip strength 4/5 on left, 5/5 on right, otherwise normal strength in bilateral upper extremities  Laboratory: Recent Labs  Lab 12/19/17 1103  12/19/17 1115 12/19/17 1400  WBC 9.4  --  8.8  HGB 9.6* 11.6* 9.6*  HCT  30.8* 34.0* 30.9*  PLT 389  --  420*   Recent Labs  Lab 12/19/17 1103 12/19/17 1115 12/19/17 1400  NA 139 141  --   K 3.5 3.4*  --   CL 108 105  --   CO2 21*  --   --   BUN <5* <3*  --   CREATININE 0.66 0.60 0.64  CALCIUM 8.6*  --   --   PROT 6.7  --   --   BILITOT 0.4  --   --   ALKPHOS 87  --   --   ALT 14  --   --   AST 24  --   --   GLUCOSE 101* 101*  --      Imaging/Diagnostic Tests:  Mr Brain Wo Contrast  Result Date: 12/20/2017 CLINICAL DATA:  TIA.  Sudden onset left-sided weakness. EXAM: MRI HEAD WITHOUT CONTRAST MRA HEAD WITHOUT CONTRAST TECHNIQUE: Multiplanar, multiecho pulse sequences of the brain and surrounding structures were obtained without intravenous contrast. Angiographic images of the head were obtained using MRA technique without contrast. COMPARISON:  Head CT from yesterday FINDINGS: MRI HEAD FINDINGS Brain: 9 mm area of restricted diffusion in the posterior right thalamus. Few FLAIR hyperintensities in the cerebral white matter, presumably chronic microvascular ischemia in this setting, although nonspecific. Normal brain volume. No hemorrhage, hydrocephalus, or masslike finding. Partially empty sella, incidental in this setting. Vascular: Arterial findings below. Normal dural venous sinus flow voids. Skull and upper cervical spine: No evidence of marrow lesion. Sinuses/Orbits: Minor mucosal thickening in the mastoids. Negative nasopharynx. MRA HEAD FINDINGS Limited or no flow in the right vertebral artery until just before the vertebrobasilar segment. No historical indication of neck pain, but dissection is a consideration in this young patient. The left vertebral and basilar arteries are smooth and widely patent. There is a high-grade narrowing just beyond the right P2 bifurcation. Anterior circulation vessels are smooth and widely patent. Negative for aneurysm. These results were called by telephone at the time of interpretation on 12/20/2017 at 8:13 am to PA  Costello , who verbally acknowledged these results. IMPRESSION: 1. Acute lacunar infarct in the right thalamus. 2. Absent or abnormal flow in the right vertebral artery beginning in the neck. Dissection is a consideration, please correlate for neck pain. 3. Distal right P2 high-grade narrowing. Given the other medium size vessels are normal-appearing, this may be embolic disease. Electronically Signed   By: Marnee Spring M.D.   On: 12/20/2017 08:14   Mr Maxine Glenn Head Wo Contrast  Result Date: 12/20/2017 CLINICAL DATA:  TIA.  Sudden onset left-sided weakness. EXAM: MRI HEAD WITHOUT CONTRAST MRA HEAD WITHOUT CONTRAST TECHNIQUE: Multiplanar, multiecho pulse sequences of the brain and surrounding structures were obtained without intravenous contrast. Angiographic images of the head were obtained using MRA technique without contrast. COMPARISON:  Head CT from yesterday FINDINGS: MRI HEAD FINDINGS Brain: 9 mm area of restricted diffusion in the posterior right thalamus. Few FLAIR hyperintensities in the cerebral white matter, presumably chronic microvascular ischemia in this setting, although nonspecific. Normal brain volume. No hemorrhage, hydrocephalus, or masslike finding. Partially empty sella, incidental in this setting. Vascular: Arterial findings below. Normal dural venous sinus flow voids. Skull and upper cervical spine: No evidence of marrow lesion. Sinuses/Orbits: Minor mucosal thickening in the mastoids. Negative nasopharynx. MRA HEAD FINDINGS Limited or no flow in the right vertebral artery until  just before the vertebrobasilar segment. No historical indication of neck pain, but dissection is a consideration in this young patient. The left vertebral and basilar arteries are smooth and widely patent. There is a high-grade narrowing just beyond the right P2 bifurcation. Anterior circulation vessels are smooth and widely patent. Negative for aneurysm. These results were called by telephone at the time of  interpretation on 12/20/2017 at 8:13 am to PA Costello , who verbally acknowledged these results. IMPRESSION: 1. Acute lacunar infarct in the right thalamus. 2. Absent or abnormal flow in the right vertebral artery beginning in the neck. Dissection is a consideration, please correlate for neck pain. 3. Distal right P2 high-grade narrowing. Given the other medium size vessels are normal-appearing, this may be embolic disease. Electronically Signed   By: Marnee Spring M.D.   On: 12/20/2017 08:14   Ct Head Code Stroke Wo Contrast  Result Date: 12/19/2017 CLINICAL DATA:  Code stroke. LEFT-sided numbness. Chronic headache. EXAM: CT HEAD WITHOUT CONTRAST TECHNIQUE: Contiguous axial images were obtained from the base of the skull through the vertex without intravenous contrast. COMPARISON:  09/15/2005. FINDINGS: Brain: No evidence of acute infarction, hemorrhage, hydrocephalus, extra-axial collection or mass lesion/mass effect. Vascular: No hyperdense vessel or unexpected calcification. Skull: Normal. Negative for fracture or focal lesion. Sinuses/Orbits: No acute finding. Other: None. ASPECTS [facility] Stroke Program Early CT Score) - Ganglionic level infarction (caudate, lentiform nuclei, internal capsule, insula, M1-M3 cortex): 7 - Supraganglionic infarction (M4-M6 cortex): 3 Total score (0-10 with 10 being normal): 10 IMPRESSION: 1. Negative exam 2. ASPECTS is 10 These results were communicated to Dr. Laurence Slate at 11:33 amon 1/30/2019by text page via the [facility] messaging system. Electronically Signed   By: Elsie Stain M.D.   On: 12/19/2017 11:34     Lennox Solders, MD 12/20/2017, 7:11 AM PGY-1, [facility] Family Medicine FPTS Intern pager: [phone], text pages welcome

## 2017-12-20 NOTE — Progress Notes (Signed)
OT Cancellation Note  Patient Details Name: Melissa LenzJosephine Nicole MRN: [id number] DOB: 12/05/1975   Cancelled Treatment:    Reason Eval/Treat Not Completed: Patient at procedure or test/ unavailable. 2nd attempt at OT eval today, will follow up as time allows.  [name] M.S., OTR/L Pager: [phone]  12/20/2017, 3:30 PM

## 2017-12-20 NOTE — Evaluation (Signed)
Physical Therapy Evaluation Patient Details Name: Melissa Krueger MRN: [id number] DOB: Aug 06, 1976 Today's Date: 12/20/2017   History of Present Illness   42 y.o. female presenting with left-sided weakness and dizziness. PMH is significant for hypertension, history of pulmonary embolism, tobacco use, obesity. CT head that was negative. MRI revealed Acute lacunar infarct in the right thalamus. as well as Absent or abnormal flow in the right vertebral artery beginning in the neck and Distal right P2 high-grade narrowing.  Clinical Impression  Pt admitted with above diagnosis. Pt currently with functional limitations due to the deficits listed below (see PT Problem List). PTA pt independent with all aspects of life, working two jobs. Pt is limited by dizziness, slight L sided weakness and decreased balance. Pt currently Mod I for bed mobility, and transfers, min guard with straight line walking without assistive device. Pt will benefit from skilled PT to increase their independence and safety with mobility to allow discharge to the venue listed below.       Follow Up Recommendations Home health PT;Supervision/Assistance - 24 hour    Equipment Recommendations  Rolling walker with 5" wheels    Recommendations for Other Services       Precautions / Restrictions Precautions Precautions: Fall Restrictions Weight Bearing Restrictions: No      Mobility  Bed Mobility Overal bed mobility: Modified Independent                Transfers Overall transfer level: Modified independent               General transfer comment: good power up and steadying in standing   Ambulation/Gait Ambulation/Gait assistance: Min guard Ambulation Distance (Feet): 200 Feet Assistive device: None Gait Pattern/deviations: Step-through pattern;Staggering left;Narrow base of support Gait velocity: slow  Gait velocity interpretation: Below normal speed for age/gender General Gait Details: min guard  for safety, mild instability, no overt LoB, 2x staggering steps to L pt able to recorrect without assist, pt in low guard position throughout ambulation   Stairs Stairs: Yes Stairs assistance: Min guard Stair Management: Two rails;Alternating pattern;Forwards Number of Stairs: 6 General stair comments: hands on min guard for safety, stubbed L toe with ascent of stairs, no LoB,   Wheelchair Mobility    Modified Rankin (Stroke Patients Only) Modified Rankin (Stroke Patients Only) Pre-Morbid Rankin Score: No symptoms Modified Rankin: No significant disability     Balance Overall balance assessment: Needs assistance Sitting-balance support: No upper extremity supported;Feet supported Sitting balance-Leahy Scale: Good     Standing balance support: No upper extremity supported;During functional activity Standing balance-Leahy Scale: Fair Standing balance comment: decreased stability with reaching outside BoS                             Pertinent Vitals/Pain Pain Assessment: 0-10 Pain Score: 6  Pain Location: headache Pain Descriptors / Indicators: Headache Pain Intervention(s): Limited activity within patient's tolerance;Monitored during session    Home Living Family/patient expects to be discharged to:: Private residence Living Arrangements: Children Available Help at Discharge: Family;Available PRN/intermittently Type of Home: House Home Access: Level entry     Home Layout: Two level;Able to live on main level with bedroom/bathroom Home Equipment: Hand held shower head      Prior Function Level of Independence: Independent         Comments: works two jobs, independent in IT trainer Dominance   Dominant Hand: Right    Extremity/Trunk Assessment  Upper Extremity Assessment Upper Extremity Assessment: Defer to OT evaluation    Lower Extremity Assessment Lower Extremity Assessment: LLE deficits/detail LLE Deficits / Details: AROM WFL,  strength grossly assessed as hip flexion 4+/5, knee flex/ext 5/5, ankle dorsiflexion 4+/5, plantarflexion 5/5 LLE Sensation: decreased light touch LLE Coordination: (intact)    Cervical / Trunk Assessment Cervical / Trunk Assessment: Normal  Communication   Communication: No difficulties  Cognition Arousal/Alertness: Awake/alert Behavior During Therapy: WFL for tasks assessed/performed Overall Cognitive Status: Within Functional Limits for tasks assessed                                        General Comments General comments (skin integrity, edema, etc.): c/o dizziness with eyes open, vision grossly assessed, possible L beating nystagmus with end range of finger follow        Assessment/Plan    PT Assessment Patient needs continued PT services  PT Problem List Decreased strength;Decreased balance;Decreased mobility;Decreased safety awareness;Pain       PT Treatment Interventions DME instruction;Gait training;Stair training;Functional mobility training;Therapeutic activities;Therapeutic exercise;Balance training;Neuromuscular re-education;Cognitive remediation;Patient/family education    PT Goals (Current goals can be found in the Care Plan section)  Acute Rehab PT Goals Patient Stated Goal: get back to work PT Goal Formulation: With patient Time For Goal Achievement: 01/03/18 Potential to Achieve Goals: Good    Frequency Min 4X/week   Barriers to discharge Decreased caregiver support      Co-evaluation               AM-PAC PT "6 Clicks" Daily Activity  Outcome Measure Difficulty turning over in bed (including adjusting bedclothes, sheets and blankets)?: None Difficulty moving from lying on back to sitting on the side of the bed? : None Difficulty sitting down on and standing up from a chair with arms (e.g., wheelchair, bedside commode, etc,.)?: None Help needed moving to and from a bed to chair (including a wheelchair)?: None Help needed  walking in hospital room?: A Little Help needed climbing 3-5 steps with a railing? : None 6 Click Score: 23    End of Session Equipment Utilized During Treatment: Gait belt Activity Tolerance: Patient tolerated treatment well Patient left: in bed;with call bell/phone within reach;with bed alarm set;with family/visitor present Nurse Communication: Mobility status PT Visit Diagnosis: Unsteadiness on feet (R26.81);Other abnormalities of gait and mobility (R26.89);Difficulty in walking, not elsewhere classified (R26.2);Dizziness and giddiness (R42);Other symptoms and signs involving the nervous system (Z61.096(R29.898)    Time: [id number] PT Time Calculation (min) (ACUTE ONLY): 28 min   Charges:   PT Evaluation $PT Eval Moderate Complexity: 1 Mod PT Treatments $Gait Training: 8-22 mins   PT G Codes:        Mikal Blasdell B. Beverely RisenVan Fleet PT, DPT Acute Rehabilitation  [phone] Pager [phone]    Elon Alaslizabeth B Van Fleet 12/20/2017, 10:25 AM

## 2017-12-20 NOTE — Progress Notes (Signed)
  Echocardiogram 2D Echocardiogram has been performed.  Bambie Pizzolato L Androw 12/20/2017, 3:25 PM

## 2017-12-20 NOTE — Progress Notes (Signed)
TCD Bubble study completed. Negative TCD bubble study. Dr. Roda ShuttersXu performed the study.  No HITS at rest or during Valsalva, no apparent PFO. Marilynne Halstedita Rufino Staup, BS, RDMS, RVT

## 2017-12-20 NOTE — Care Management Note (Signed)
Case Management Note  Patient Details  Name: Melissa LenzJosephine Parslow MRN: [id number] Date of Birth: 06/18/1976  Subjective/Objective:    Pt admitted with CVA. She is from home with family.                Action/Plan: PT recommending HH services. Awaiting OT recs. CM following for d/c needs, physician orders.  Expected Discharge Date:                  Expected Discharge Plan:  Home w Home Health Services  In-House Referral:     Discharge planning Services  CM Consult  Post Acute Care Choice:    Choice offered to:     DME Arranged:    DME Agency:     HH Arranged:    HH Agency:     Status of Service:  In process, will continue to follow  If discussed at Long Length of Stay Meetings, dates discussed:    Additional Comments:  Kermit BaloKelli F Syrena Burges, RN 12/20/2017, 10:41 AM

## 2017-12-20 NOTE — Discharge Summary (Signed)
Family Medicine Teaching [facility] Discharge Summary  Patient name: Melissa Krueger Medical record number: [id number] Date of birth: Jun 27, 1976 Age: 42 y.o. 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 HTN Tobacco use Hx of iron deficiency anemia  Disposition: Home  Discharge Condition: Stable  Discharge Exam:  Gen: Alert and Oriented x 3, NAD HEENT: Normocephalic, atraumatic, PERRLA, EOMI Neck: trachea midline, mildly tender to palpation along the sternocleidomastoid on the right CV: RRR, no murmurs, normal S1, S2 split, +2 pulses dorsalis pedis bilaterally, no JVD Resp: CTAB, no wheezing, rales, or rhonchi, comfortable work of breathing Abd: non-distended, non-tender, soft, +bs in all four quadrants MSK: FROM in all four extremities Ext: no clubbing, cyanosis, or edema Neuro: CN II-XII grossly intact, right sided facial numbness, ULE grip strength 4/5, URE grip strength 5/5, BLE 5/5 Skin: warm, dry, intact, no rashes  Brief Hospital Course:  Melissa Krueger was admitted on 1/30 for left-sided weakness and was found to have a lacunar stroke in her right thalamus.  Her symptoms had greatly improved on 1/31, but she continued to have a headache and balance issues.  Neurology recommended initiation of Plavix in addition to ASA 325 mg.  Echo was negative for abnormalities, including a normal bubble study and no PFO.  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 Flame shaped termination of normal enhancement at the vessel origin. Intermittent, poor  reconstituted enhancement to the vertebrobasilar junction. 2. 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 Her grip strength on the left side was still weaker than her right. She was stable for discharge on 2/1 and was discharged home with home health on 2/1.  Issues for Follow Up:  1. Patient had a right lacunar infarct of the thalamus and will require dual antiplatlet therapy for 3 months. Neurology recommends Plavix at 75mg  and ASA at 325mg . After the 3 month period she will need to stop ASA after 3 months and just stay on plavix. 2. Patient has history of possible migraine vs tension headaches. She has taken Ibuprofen in the past but this is not recommended due to risk of bleed as she will be on dual antiplatelet therapy.  3. Patient had hypokalemia and was given k-durr BID on day of discharge. Consider follow up BMP to ensure her electrolyte levels are normalized.  Significant Procedures:   MRI of head and MRA of neck  Significant Labs and Imaging:  Recent Labs  Lab 12/19/17 1103 12/19/17 1115 12/19/17 1400 12/21/17 0820  WBC 9.4  --  8.8 9.2  HGB 9.6* 11.6* 9.6* 9.5*  HCT 30.8* 34.0* 30.9* 30.9*  PLT 389  --  420* 389   Recent Labs  Lab 12/19/17 1103 12/19/17 1115 12/19/17 1400 12/21/17 0820  NA 139 141  --  137  K 3.5 3.4*  --  3.0*  CL 108 105  --  106  CO2 21*  --   --  21*  GLUCOSE 101* 101*  --  151*  BUN <5* <3*  --  6  CREATININE 0.66 0.60 0.64 0.81  CALCIUM 8.6*  --   --  8.8*  ALKPHOS 87  --   --   --   AST 24  --   --   --   ALT 14  --   --   --   ALBUMIN 3.5  --   --   --     Ct Angio Head W Or Wo Contrast  Addendum Date: 12/20/2017   ADDENDUM REPORT: 12/20/2017 12:22 ADDENDUM:  Study discussed by telephone with Dr. Marvel Plan on 12/20/2017 at 1208 hrs. Electronically Signed   By: Odessa Fleming M.D.   On: 12/20/2017 12:22   Result Date: 12/20/2017 CLINICAL DATA:  42 year old female with sudden onset left side weakness. Head MRI and intracranial MRA reveal an acute lacunar infarct in the right thalamus and abnormal distal right vertebral artery. EXAM: CT ANGIOGRAPHY HEAD AND NECK TECHNIQUE: Multidetector CT imaging of the head and neck was performed using the standard protocol during bolus administration of intravenous contrast. Multiplanar CT image reconstructions and MIPs were obtained to evaluate the vascular anatomy. Carotid stenosis measurements (when applicable) are obtained utilizing NASCET criteria, using the distal internal carotid diameter as the denominator. CONTRAST:  50mL ISOVUE-370 IOPAMIDOL (ISOVUE-370) INJECTION 76% COMPARISON:  Brain MRI and intracranial MRA 0608 hr today. Head CT without contrast 12/19/2017. FINDINGS: CT HEAD Brain: The small acute dorsal right thalamic infarct is minimally evident on CT (series 5, image 16). Stable and normal gray-white matter differentiation elsewhere. No midline shift, ventriculomegaly, mass effect, evidence of mass lesion, intracranial hemorrhage or evidence of cortically based acute infarction. Partially empty sella. Calvarium and skull base: Stable. No acute osseous abnormality identified. Paranasal sinuses: Clear. Orbits: Visualized orbits and scalp soft tissues are within normal limits. CTA NECK Skeleton: Occasional dental caries. Multiple prior tooth extractions. No acute osseous abnormality identified. Upper chest: Negative lung apices. No superior mediastinal lymphadenopathy. Other neck: Negative.  No cervical lymphadenopathy. Aortic arch: 3 vessel arch configuration. Minimal soft and calcified plaque at the left subclavian artery origin. Otherwise no arch atherosclerosis. Right carotid system: Negative. Left carotid system: Mild  soft and calcified plaque at the posterior left ICA origin. No stenosis. Vertebral arteries: No proximal right subclavian artery plaque or stenosis. Abrupt triangular termination of enhancement of the right vertebral artery just beyond its origin (series 18, image 33). Reconstitution of enhancement in the right V 2 segment but multifocal irregularity of the vessel, and high-grade irregularity and stenosis in the right V3 segment (series 11, image 115). Subsequent substantially decreased enhancement in the distal right vertebral artery at the skull base. Minor soft and calcified plaque at the left subclavian artery origin with no stenosis. Normal left vertebral artery origin (series 15, image 161). Normal left vertebral artery to the skull base. CTA HEAD Posterior circulation: Poor enhancement of the distal right vertebral artery to the vertebrobasilar junction. The distal left vertebral artery is normal. The basilar artery is normal. SCA and PCA origins are normal. Posterior communicating arteries are diminutive or absent. The left PCA branches are within normal limits. The right PCA P2 segment is mildly irregular (series 17, image 20), and there is poor enhancement of the right PCA P3 superior division. Anterior circulation: Both ICA siphons are patent without atherosclerosis or stenosis. Normal ophthalmic artery origins. Normal carotid termini. Normal MCA and ACA origins. Anterior communicating artery and bilateral ACA branches are  within normal limits. The left MCA M1 segment, bifurcation, and left MCA branches are within normal limits. The right MCA M1 segment, bifurcation, and right MCA branches are within normal limits. Venous sinuses: Patent. Anatomic variants: None. Delayed phase: No abnormal enhancement identified. Review of the MIP images confirms the above findings IMPRESSION: 1. 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 Flame shaped  termination of normal enhancement at the vessel origin. Intermittent, poor reconstituted enhancement to the vertebrobasilar junction. 2. 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 3. Minimal atherosclerosis at only the left subclavian and left ICA origins. Normal left vertebral artery. 4. The small right thalamic infarct is largely occult on CT, stable CT appearance of the brain. Electronically Signed: By: Odessa FlemingH  Hall M.D. On: 12/20/2017 12:03   Ct Angio Neck W Or Wo Contrast  Addendum Date: 12/20/2017   ADDENDUM REPORT: 12/20/2017 12:22 ADDENDUM: Study discussed by telephone with Dr. Marvel PlanJINDONG XU on 12/20/2017 at 1208 hrs. Electronically Signed   By: Odessa FlemingH  Hall M.D.   On: 12/20/2017 12:22   Result Date: 12/20/2017 CLINICAL DATA:  42 year old female with sudden onset left side weakness. Head MRI and intracranial MRA reveal an acute lacunar infarct in the right thalamus and abnormal distal right vertebral artery. EXAM: CT ANGIOGRAPHY HEAD AND NECK TECHNIQUE: Multidetector CT imaging of the head and neck was performed using the standard protocol during bolus administration of intravenous contrast. Multiplanar CT image reconstructions and MIPs were obtained to evaluate the vascular anatomy. Carotid stenosis measurements (when applicable) are obtained utilizing NASCET criteria, using the distal internal carotid diameter as the denominator. CONTRAST:  50mL ISOVUE-370 IOPAMIDOL (ISOVUE-370) INJECTION 76% COMPARISON:  Brain MRI and intracranial MRA 0608 hr today. Head CT without contrast 12/19/2017. FINDINGS: CT HEAD Brain: The small acute dorsal right thalamic infarct is minimally evident on CT (series 5, image 16). Stable and normal gray-white matter differentiation elsewhere. No midline shift, ventriculomegaly, mass effect, evidence of mass lesion, intracranial hemorrhage or evidence of cortically based  acute infarction. Partially empty sella. Calvarium and skull base: Stable. No acute osseous abnormality identified. Paranasal sinuses: Clear. Orbits: Visualized orbits and scalp soft tissues are within normal limits. CTA NECK Skeleton: Occasional dental caries. Multiple prior tooth extractions. No acute osseous abnormality identified. Upper chest: Negative lung apices. No superior mediastinal lymphadenopathy. Other neck: Negative.  No cervical lymphadenopathy. Aortic arch: 3 vessel arch configuration. Minimal soft and calcified plaque at the left subclavian artery origin. Otherwise no arch atherosclerosis. Right carotid system: Negative. Left carotid system: Mild soft and calcified plaque at the posterior left ICA origin. No stenosis. Vertebral arteries: No proximal right subclavian artery plaque or stenosis. Abrupt triangular termination of enhancement of the right vertebral artery just beyond its origin (series 18, image 33). Reconstitution of enhancement in the right V 2 segment but multifocal irregularity of the vessel, and high-grade irregularity and stenosis in the right V3 segment (series 11, image 115). Subsequent substantially decreased enhancement in the distal right vertebral artery at the skull base. Minor soft and calcified plaque at the left subclavian artery origin with no stenosis. Normal left vertebral artery origin (series 15, image 161). Normal left vertebral artery to the skull base. CTA HEAD Posterior circulation: Poor enhancement of the distal right vertebral artery to the vertebrobasilar junction. The distal left vertebral artery is normal. The basilar artery is normal. SCA and PCA origins are normal.  Posterior communicating arteries are diminutive or absent. The left PCA branches are within normal limits. The right PCA P2 segment is mildly irregular (series 17, image 20), and there is poor enhancement of the right PCA P3 superior division. Anterior circulation: Both ICA siphons are patent  without atherosclerosis or stenosis. Normal ophthalmic artery origins. Normal carotid termini. Normal MCA and ACA origins. Anterior communicating artery and bilateral ACA branches are within normal limits. The left MCA M1 segment, bifurcation, and left MCA branches are within normal limits. The right MCA M1 segment, bifurcation, and right MCA branches are within normal limits. Venous sinuses: Patent. Anatomic variants: None. Delayed phase: No abnormal enhancement identified. Review of the MIP images confirms the above findings IMPRESSION: 1. 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 Flame shaped termination of normal enhancement at the vessel origin. Intermittent, poor reconstituted enhancement to the vertebrobasilar junction. 2. 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 3. Minimal atherosclerosis at only the left subclavian and left ICA origins. Normal left vertebral artery. 4. The small right thalamic infarct is largely occult on CT, stable CT appearance of the brain. Electronically Signed: By: Odessa Fleming M.D. On: 12/20/2017 12:03     Results/Tests Pending at Time of Discharge:   None  Discharge Medications:  Allergies as of 12/21/2017   No Known Allergies     Medication List    STOP taking these medications   HYDROcodone-acetaminophen 5-325 MG tablet Commonly known as:  NORCO/VICODIN   oxyCODONE 5 MG immediate release tablet Commonly known as:  ROXICODONE   rivaroxaban 20 MG Tabs tablet Commonly known as:  XARELTO     TAKE these medications   acetaminophen 325 MG tablet Commonly known as:  TYLENOL Take 2 tablets (650 mg total) by mouth every 6 (six) hours as needed for mild pain (or Fever >/= 101).   aspirin 325 MG EC tablet Take 1 tablet (325 mg total) by mouth daily. Start taking on:  12/22/2017    atorvastatin 80 MG tablet Commonly known as:  LIPITOR Take 1 tablet (80 mg total) by mouth daily at 6 PM.   clopidogrel 75 MG tablet Commonly known as:  PLAVIX Take 1 tablet (75 mg total) by mouth daily. Start taking on:  12/22/2017   ferrous sulfate 325 (65 FE) MG tablet Take 1 tablet (325 mg total) by mouth daily with breakfast.   lisinopril-hydrochlorothiazide 20-25 MG tablet Commonly known as:  PRINZIDE,ZESTORETIC Take 1 tablet by mouth daily.   traMADol 50 MG tablet Commonly known as:  ULTRAM Take 1-2 tablets (50-100 mg total) by mouth every 6 (six) hours as needed.   vitamin C 500 MG tablet Commonly known as:  ASCORBIC ACID Take 500 mg by mouth daily.            Durable Medical Equipment  (From admission, onward)        Start     Ordered   12/21/17 1152  For home use only DME Walker rolling  Once    Question:  Patient needs a walker to treat with the following condition  Answer:  Stroke [facility])   12/21/17 1152      Discharge Instructions: Please refer to Patient Instructions section of EMR for full details.  Patient was counseled important signs and symptoms that should prompt return to medical care, changes in medications, dietary instructions, activity restrictions, and follow up  appointments.   Follow-Up Appointments:   Arlyce Harman, DO 12/21/2017, 2:37 PM PGY-1, [facility]

## 2017-12-21 LAB — CBC
HEMATOCRIT: 30.9 % — AB (ref 36.0–46.0)
Hemoglobin: 9.5 g/dL — ABNORMAL LOW (ref 12.0–15.0)
MCH: 19.3 pg — ABNORMAL LOW (ref 26.0–34.0)
MCHC: 30.7 g/dL (ref 30.0–36.0)
MCV: 62.9 fL — ABNORMAL LOW (ref 78.0–100.0)
Platelets: 389 10*3/uL (ref 150–400)
RBC: 4.91 MIL/uL (ref 3.87–5.11)
RDW: 18.5 % — AB (ref 11.5–15.5)
WBC: 9.2 10*3/uL (ref 4.0–10.5)

## 2017-12-21 LAB — BASIC METABOLIC PANEL
Anion gap: 10 (ref 5–15)
BUN: 6 mg/dL (ref 6–20)
CALCIUM: 8.8 mg/dL — AB (ref 8.9–10.3)
CO2: 21 mmol/L — AB (ref 22–32)
Chloride: 106 mmol/L (ref 101–111)
Creatinine, Ser: 0.81 mg/dL (ref 0.44–1.00)
GFR calc Af Amer: >60 mL/min (ref >60)
GFR calc non Af Amer: >60 mL/min (ref >60)
GLUCOSE: 151 mg/dL — AB (ref 65–99)
Potassium: 3 mmol/L — ABNORMAL LOW (ref 3.5–5.1)
Sodium: 137 mmol/L (ref 135–145)

## 2017-12-21 MED ORDER — ATORVASTATIN CALCIUM 80 MG PO TABS: 80.0000 mg | ORAL_TABLET | Freq: Every day | ORAL | 0 refills | Status: DC

## 2017-12-21 MED ORDER — HYDROCHLOROTHIAZIDE 25 MG PO TABS
25.0000 mg | ORAL_TABLET | Freq: Every day | ORAL | Status: DC
  Administered 2017-12-21: 25 mg via ORAL
  Filled 2017-12-21: qty 1

## 2017-12-21 MED ORDER — LISINOPRIL-HYDROCHLOROTHIAZIDE 20-25 MG PO TABS: 1.0000 | ORAL_TABLET | Freq: Every day | ORAL | Status: DC

## 2017-12-21 MED ORDER — ASPIRIN 325 MG PO TBEC: 325.0000 mg | DELAYED_RELEASE_TABLET | Freq: Every day | ORAL | 0 refills | Status: DC

## 2017-12-21 MED ORDER — POTASSIUM CHLORIDE CRYS ER 20 MEQ PO TBCR
40.0000 meq | EXTENDED_RELEASE_TABLET | Freq: Two times a day (BID) | ORAL | Status: AC
  Administered 2017-12-21: 40 meq via ORAL
  Filled 2017-12-21: qty 2

## 2017-12-21 MED ORDER — LISINOPRIL 20 MG PO TABS
20.0000 mg | ORAL_TABLET | Freq: Every day | ORAL | Status: DC
  Administered 2017-12-21: 20 mg via ORAL
  Filled 2017-12-21: qty 1

## 2017-12-21 MED ORDER — CLOPIDOGREL BISULFATE 75 MG PO TABS: 75.0000 mg | ORAL_TABLET | Freq: Every day | ORAL | 0 refills | Status: DC

## 2017-12-21 NOTE — Progress Notes (Signed)
  Speech Language Pathology Treatment: Cognitive-Linquistic  Patient Details Name: Melissa Krueger MRN: [name] DOB: 06/17/1976 Today's Date: 12/21/2017 Time: [id number] SLP Time Calculation (min) (ACUTE ONLY): 8 min  Assessment / Plan / Recommendation Clinical Impression  Pt was seen for skilled ST targeting education.  Pt was sitting at edge of bed with bag packed and jacket on in anticipation of discharge.  SLP discussed recommendations that pt have initial assistance with managing medications and finances once discharged home.  SLP specifically recommended that pt pick up a pill box from the drugstore on her way home when she gets prescriptions refilled.  SLP also discussed memory compensatory strategies, such as using a memory notebook and keeping reminders in the calendar on her phone to maximize recall of dates, events, and appointments.  SLP discussed the importance of compliance with medications and keeping doctor's appointments in preventing stroke recurrence.  All questions were answered to pt's satisfaction at this time.    HPI HPI:  42 y.o. female presenting with left-sided weakness and dizziness. PMH is significant for hypertension, history of pulmonary embolism, tobacco use, obesity. CT head that was negative. MRI revealed Acute lacunar infarct in the right thalamus. as well as Absent or abnormal flow in the right vertebral artery beginning in the neck and Distal right P2 high-grade narrowing.      SLP Plan  Discharge SLP treatment due to (comment)(pt discharging home, education complete )       Recommendations                   Follow up Recommendations: None Plan: Discharge SLP treatment due to (comment)(pt discharging home, education complete )       GO                Rosie Golson, Melanee SpryNicole L 12/21/2017, 3:38 PM

## 2017-12-21 NOTE — Progress Notes (Signed)
Family Medicine Teaching Service Daily Progress Note Intern Pager: [phone]  Patient name: Melissa Krueger Medical record number: [id number] Date of birth: 02/09/1976 Age: 42 y.o. Gender: female  Primary Care Provider: Berton BonMikell, Asiyah Zahra, MD Consultants: neurology Code Status: full  Pt Overview and Major Events to Date:  1/30 admitted  Assessment and Plan:  Melissa Krueger is a 42 y.o. female presenting with left-sided weakness and dizziness. 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 Stable. Patient presented with uncontrolled hypertension, current tobacco use and morbid obesity presenting with left-sided weakness in the arm, leg and facial numbness most concerning for acute CVA.  CT head negative. MRI showed right sided thalamic infarct and right vertebral artery dissection is a concern with distal right P2 high-grade narrowing. 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  Was discharged with Xarelto but stopped taking due to concerns of side effects and she has not followed up with family practice since May 2016.  Denies taking any blood pressure medication. Lipids notable for LDL 121, HDL 35.  Hgb A1c 5.9.   -Neurology following appreciate recommendations; Dual antiplatelet therapy for 3 months, then Plavix alone. -Echocardiogram - no PFO found, EF 65-70% -Continue Plavix 75mg  daily -Continue ASA 325mg  daily -Continue atorvastatin 80 mg -Discontinue permissive hypertension SBP <180 and DBP <105 for 48hrs then goal is normal BP goal of SBP <140 and DBP <100 -Tobacco cessation -Cardiac monitoring -PT/OT evaluation; PT recommends home health, OT still to see -Speech eval recommends home health  History of PE Back in 2016. Discharged on Xarelto and only completed 1 week of the medication had one follow-up  appointment with PCP.  No further episodes episodes of chest pain, shortness of breath, lower extremity edema, redness or pain.  Current smoker and has untreated hypertension. EKG is regular rate and sinus rhythm.  No need for any coagulation at this time. -Continue aspirin 81 mg -Continue to monitor on telemetry -Continue Plavix 75mg  daily  History of hypertension Document a history of hypertension and was prescribed lisinopril hydrochlorothiazide 20-25 mg daily.  Review medication expired 04/15/2016.  She is unable to give me an exact date but I would imagine she has not taken this for at least a year.  Currently hypertensive to 150s/100.  Endorsing frontal headache without red flag signs or symptoms.  -D/C permissive hypertension for 48hrs, then resume goal of normotension -Cont lisinopril hydrochlorthiazide 20-25 mg -Monitor on telemetry  Hyperlipidemia: Chronic. Recent lipid panel showed LDL >100 and HDL 35. - Cont Atorvastatin 80mg  - Diet and exercise benefits discussed  Tobacco abuse Smokes 6-7 cigarettes daily.  Discussed importance of quitting.  History of iron deficiency anemia - Getting CBC to trend Hgb  Hypokalemia: Acute. Stable. - Giving K-Durr 40mEq BID today - Get BMP tomorrow am  FEN/GI: heart healthy Prophylaxis: lovenox   Disposition: likely home  Subjective:  Patient says she is having a headache that is located at her temples bilaterally that radiates around the top of her head. She just received Ibuprofen for it before I spoke with her. The headache feels the same in quality as the headaches that she has had in the past. She denies chest pain, SOB, difficulty breathing, abdominal pain, nausea, vomiting, pain on urination, constipation, or diarrhea.   She no longer endorses left sided facial numbness, no difficulty speaking or eating.  Objective: Temp:  [97.8  F (36.6 C)-98.6 F (37 C)] 97.8 F (36.6 C) (02/01 0558) Pulse Rate:  [65-75] 70 (02/01  0558) Resp:  [16-18] 16 (02/01 0558) BP: (141-159)/(82-95) 149/87 (02/01 0558) SpO2:  [98 %-100 %] 100 % (02/01 0558) Physical Exam: Gen: Alert and Oriented x 3, NAD HEENT: Normocephalic, atraumatic, PERRLA, EOMI Neck: trachea midline, mildly tender to palpation along the sternocleidomastoid on the right CV: RRR, no murmurs, normal S1, S2 split, +2 pulses dorsalis pedis bilaterally, no JVD Resp: CTAB, no wheezing, rales, or rhonchi, comfortable work of breathing Abd: non-distended, non-tender, soft, +bs in all four quadrants MSK: FROM in all four extremities Ext: no clubbing, cyanosis, or edema Neuro: CN II-XII grossly intact, right sided facial numbness, ULE grip strength 4/5, URE grip strength 5/5, BLE 5/5 Skin: warm, dry, intact, no rashes  Laboratory: Recent Labs  Lab 12/19/17 1103 12/19/17 1115 12/19/17 1400 12/21/17 0820  WBC 9.4  --  8.8 9.2  HGB 9.6* 11.6* 9.6* 9.5*  HCT 30.8* 34.0* 30.9* 30.9*  PLT 389  --  420* 389   Recent Labs  Lab 12/19/17 1103 12/19/17 1115 12/19/17 1400 12/21/17 0820  NA 139 141  --  137  K 3.5 3.4*  --  3.0*  CL 108 105  --  106  CO2 21*  --   --  21*  BUN <5* <3*  --  6  CREATININE 0.66 0.60 0.64 0.81  CALCIUM 8.6*  --   --  8.8*  PROT 6.7  --   --   --   BILITOT 0.4  --   --   --   ALKPHOS 87  --   --   --   ALT 14  --   --   --   AST 24  --   --   --   GLUCOSE 101* 101*  --  151*     Imaging/Diagnostic Tests:  Ct Angio Head W Or Wo Contrast  Addendum Date: 12/20/2017   ADDENDUM REPORT: 12/20/2017 12:22 ADDENDUM: Study discussed by telephone with Dr. Marvel Plan on 12/20/2017 at 1208 hrs. Electronically Signed   By: Odessa Fleming M.D.   On: 12/20/2017 12:22   Result Date: 12/20/2017 CLINICAL DATA:  42 year old female with sudden onset left side weakness. Head MRI and intracranial MRA reveal an acute lacunar infarct in the right thalamus and abnormal distal right vertebral artery. EXAM: CT ANGIOGRAPHY HEAD AND NECK TECHNIQUE:  Multidetector CT imaging of the head and neck was performed using the standard protocol during bolus administration of intravenous contrast. Multiplanar CT image reconstructions and MIPs were obtained to evaluate the vascular anatomy. Carotid stenosis measurements (when applicable) are obtained utilizing NASCET criteria, using the distal internal carotid diameter as the denominator. CONTRAST:  50mL ISOVUE-370 IOPAMIDOL (ISOVUE-370) INJECTION 76% COMPARISON:  Brain MRI and intracranial MRA 0608 hr today. Head CT without contrast 12/19/2017. FINDINGS: CT HEAD Brain: The small acute dorsal right thalamic infarct is minimally evident on CT (series 5, image 16). Stable and normal gray-white matter differentiation elsewhere. No midline shift, ventriculomegaly, mass effect, evidence of mass lesion, intracranial hemorrhage or evidence of cortically based acute infarction. Partially empty sella. Calvarium and skull base: Stable. No acute osseous abnormality identified. Paranasal sinuses: Clear. Orbits: Visualized orbits and scalp soft tissues are within normal limits. CTA NECK Skeleton: Occasional dental caries. Multiple prior tooth extractions. No acute osseous abnormality identified. Upper chest: Negative lung apices. No superior mediastinal lymphadenopathy. Other neck: Negative.  No cervical lymphadenopathy. Aortic arch: 3 vessel  arch configuration. Minimal soft and calcified plaque at the left subclavian artery origin. Otherwise no arch atherosclerosis. Right carotid system: Negative. Left carotid system: Mild soft and calcified plaque at the posterior left ICA origin. No stenosis. Vertebral arteries: No proximal right subclavian artery plaque or stenosis. Abrupt triangular termination of enhancement of the right vertebral artery just beyond its origin (series 18, image 33). Reconstitution of enhancement in the right V 2 segment but multifocal irregularity of the vessel, and high-grade irregularity and stenosis in the  right V3 segment (series 11, image 115). Subsequent substantially decreased enhancement in the distal right vertebral artery at the skull base. Minor soft and calcified plaque at the left subclavian artery origin with no stenosis. Normal left vertebral artery origin (series 15, image 161). Normal left vertebral artery to the skull base. CTA HEAD Posterior circulation: Poor enhancement of the distal right vertebral artery to the vertebrobasilar junction. The distal left vertebral artery is normal. The basilar artery is normal. SCA and PCA origins are normal. Posterior communicating arteries are diminutive or absent. The left PCA branches are within normal limits. The right PCA P2 segment is mildly irregular (series 17, image 20), and there is poor enhancement of the right PCA P3 superior division. Anterior circulation: Both ICA siphons are patent without atherosclerosis or stenosis. Normal ophthalmic artery origins. Normal carotid termini. Normal MCA and ACA origins. Anterior communicating artery and bilateral ACA branches are within normal limits. The left MCA M1 segment, bifurcation, and left MCA branches are within normal limits. The right MCA M1 segment, bifurcation, and right MCA branches are within normal limits. Venous sinuses: Patent. Anatomic variants: None. Delayed phase: No abnormal enhancement identified. Review of the MIP images confirms the above findings IMPRESSION: 1. 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 Flame shaped termination of normal enhancement at the vessel origin. Intermittent, poor reconstituted enhancement to the vertebrobasilar junction. 2. 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 3. Minimal atherosclerosis at only the left subclavian and left ICA origins. Normal left vertebral artery. 4. The small right  thalamic infarct is largely occult on CT, stable CT appearance of the brain. Electronically Signed: By: Odessa Fleming M.D. On: 12/20/2017 12:03   Ct Angio Neck W Or Wo Contrast  Addendum Date: 12/20/2017   ADDENDUM REPORT: 12/20/2017 12:22 ADDENDUM: Study discussed by telephone with Dr. Marvel Plan on 12/20/2017 at 1208 hrs. Electronically Signed   By: Odessa Fleming M.D.   On: 12/20/2017 12:22   Result Date: 12/20/2017 CLINICAL DATA:  42 year old female with sudden onset left side weakness. Head MRI and intracranial MRA reveal an acute lacunar infarct in the right thalamus and abnormal distal right vertebral artery. EXAM: CT ANGIOGRAPHY HEAD AND NECK TECHNIQUE: Multidetector CT imaging of the head and neck was performed using the standard protocol during bolus administration of intravenous contrast. Multiplanar CT image reconstructions and MIPs were obtained to evaluate the vascular anatomy. Carotid stenosis measurements (when applicable) are obtained utilizing NASCET criteria, using the distal internal carotid diameter as the denominator. CONTRAST:  50mL ISOVUE-370 IOPAMIDOL (ISOVUE-370) INJECTION 76% COMPARISON:  Brain MRI and intracranial MRA 0608 hr today. Head CT without contrast 12/19/2017. FINDINGS: CT HEAD Brain: The small acute dorsal right thalamic infarct is minimally evident on CT (series 5, image 16). Stable and normal gray-white matter differentiation elsewhere. No midline shift, ventriculomegaly, mass effect, evidence of mass lesion, intracranial  hemorrhage or evidence of cortically based acute infarction. Partially empty sella. Calvarium and skull base: Stable. No acute osseous abnormality identified. Paranasal sinuses: Clear. Orbits: Visualized orbits and scalp soft tissues are within normal limits. CTA NECK Skeleton: Occasional dental caries. Multiple prior tooth extractions. No acute osseous abnormality identified. Upper chest: Negative lung apices. No superior mediastinal lymphadenopathy. Other neck:  Negative.  No cervical lymphadenopathy. Aortic arch: 3 vessel arch configuration. Minimal soft and calcified plaque at the left subclavian artery origin. Otherwise no arch atherosclerosis. Right carotid system: Negative. Left carotid system: Mild soft and calcified plaque at the posterior left ICA origin. No stenosis. Vertebral arteries: No proximal right subclavian artery plaque or stenosis. Abrupt triangular termination of enhancement of the right vertebral artery just beyond its origin (series 18, image 33). Reconstitution of enhancement in the right V 2 segment but multifocal irregularity of the vessel, and high-grade irregularity and stenosis in the right V3 segment (series 11, image 115). Subsequent substantially decreased enhancement in the distal right vertebral artery at the skull base. Minor soft and calcified plaque at the left subclavian artery origin with no stenosis. Normal left vertebral artery origin (series 15, image 161). Normal left vertebral artery to the skull base. CTA HEAD Posterior circulation: Poor enhancement of the distal right vertebral artery to the vertebrobasilar junction. The distal left vertebral artery is normal. The basilar artery is normal. SCA and PCA origins are normal. Posterior communicating arteries are diminutive or absent. The left PCA branches are within normal limits. The right PCA P2 segment is mildly irregular (series 17, image 20), and there is poor enhancement of the right PCA P3 superior division. Anterior circulation: Both ICA siphons are patent without atherosclerosis or stenosis. Normal ophthalmic artery origins. Normal carotid termini. Normal MCA and ACA origins. Anterior communicating artery and bilateral ACA branches are within normal limits. The left MCA M1 segment, bifurcation, and left MCA branches are within normal limits. The right MCA M1 segment, bifurcation, and right MCA branches are within normal limits. Venous sinuses: Patent. Anatomic variants: None.  Delayed phase: No abnormal enhancement identified. Review of the MIP images confirms the above findings IMPRESSION: 1. 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 Flame shaped termination of normal enhancement at the vessel origin. Intermittent, poor reconstituted enhancement to the vertebrobasilar junction. 2. 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 3. Minimal atherosclerosis at only the left subclavian and left ICA origins. Normal left vertebral artery. 4. The small right thalamic infarct is largely occult on CT, stable CT appearance of the brain. Electronically Signed: By: Odessa Fleming M.D. On: 12/20/2017 12:03     Arlyce Harman, DO 12/21/2017, 7:13 AM PGY-1, [facility] FPTS Intern pager: [phone], text pages welcome

## 2017-12-21 NOTE — Discharge Instructions (Signed)
You were admitted to the hospital due to dizziness and left sided weakness. You were found to have a right sided acute lacunar infarct in your thalamus. You are making considerable progress and it was determined that you would benefit from home health. It is also important to have proper follow up with your primary care provider and to take your medications as prescribed.

## 2017-12-21 NOTE — Progress Notes (Signed)
OT Evaluation/Treatment  Completed education regarding LUE HEP and home management. Pt motivated to return to work. Pt safe to DC with family. Will benefit form HHOT.   12/21/17 1600  OT Visit Information  Last OT Received On 12/21/17  Assistance Needed +1  History of Present Illness 42 y.o. female presenting with left-sided weakness and dizziness. PMH is significant for hypertension, history of pulmonary embolism, tobacco use, obesity. CT head that was negative. MRI revealed Acute lacunar infarct in the right thalamus. as well as Absent or abnormal flow in the right vertebral artery beginning in the neck and Distal right P2 high-grade narrowing.  Precautions  Precautions Fall  Restrictions  Weight Bearing Restrictions No  Home Living  Family/patient expects to be discharged to: Private residence  Living Arrangements Children  Available Help at Discharge Family;Available PRN/intermittently  Type of Home House  Home Access Level entry  Home Layout Two level;Able to live on main level with bedroom/bathroom  Alternate Level Stairs-Number of Steps 15  Alternate Level Stairs-Rails Right  Bathroom Shower/Tub Tub/shower unit;Curtain  Horticulturist, commercialBathroom Toilet Standard  Bathroom Accessibility Yes  Home Equipment Hand held shower head  Prior Function  Level of Independence Independent  Comments works two jobs, independent in Barrister's clerkeverything  Communication  Communication No difficulties  Pain Assessment  Pain Assessment No/denies pain  Cognition  Arousal/Alertness Awake/alert  Behavior During Therapy WFL for tasks assessed/performed  Overall Cognitive Status Within Functional Limits for tasks assessed  Upper Extremity Assessment  Upper Extremity Assessment LUE deficits/detail  LUE Deficits / Details isolated movements present but demonstrtes difficulty with in hand manipulation skills and coordination tasks; slow SUE integrated coordination  LUE Sensation (abnormal but states it is getting "better")   LUE Coordination decreased fine motor  Lower Extremity Assessment  Lower Extremity Assessment Defer to PT evaluation  LLE Deficits / Details AROM WFL, strength grossly assessed as hip flexion 4+/5, knee flex/ext 5/5, ankle dorsiflexion 4+/5, plantarflexion 5/5  LLE Sensation decreased light touch  LLE Coordination (intact)  Cervical / Trunk Assessment  Cervical / Trunk Assessment Normal  ADL  General ADL Comments overall set up for ADL tasks  Vision- History  Baseline Vision/History No visual deficits  Bed Mobility  Overal bed mobility Modified Independent  Transfers  Overall transfer level Modified independent  Balance  Overall balance assessment Needs assistance  Sitting-balance support No upper extremity supported;Feet supported  Sitting balance-Leahy Scale Good  Standing balance support No upper extremity supported;During functional activity  Standing balance-Leahy Scale Good  Exercises  Exercises Other exercises  Other Exercises  Other Exercises theraputty - yellow; strengthening for grip and pinch  Other Exercises fine motor adn coordination  Other Exercises eye hand coordination tasks  Other Exercises BUE integrated coordination activities  OT - End of Session  Activity Tolerance Patient tolerated treatment well  Patient left in bed;with call bell/phone within reach;with family/visitor present  Nurse Communication Other (comment) (DC needs)  OT Assessment  OT Recommendation/Assessment Patient needs continued OT Services  OT Visit Diagnosis Unsteadiness on feet (R26.81);Muscle weakness (generalized) (M62.81)  OT Problem List Decreased strength;Impaired balance (sitting and/or standing);Decreased coordination;Impaired sensation;Obesity  OT Plan  OT Frequency (ACUTE ONLY) Min 3X/week  OT Treatment/Interventions (ACUTE ONLY) Self-care/ADL training;Therapeutic exercise;Neuromuscular education;Patient/family education;Balance training;Therapeutic activities  AM-PAC OT "6  Clicks" Daily Activity Outcome Measure  Help from another person eating meals? 4  Help from another person taking care of personal grooming? 4  Help from another person toileting, which includes using toliet, bedpan, or urinal? 4  Help from another person bathing (including washing, rinsing, drying)? 4  Help from another person to put on and taking off regular upper body clothing? 4  Help from another person to put on and taking off regular lower body clothing? 4  6 Click Score 24  ADL G Code Conversion CH  OT Recommendation  Follow Up Recommendations Home health OT;Supervision - Intermittent  OT Equipment None recommended by OT  Individuals Consulted  Consulted and Agree with Results and Recommendations Patient  Acute Rehab OT Goals  Patient Stated Goal get back to work  OT Goal Formulation All assessment and education complete, DC therapy  OT Time Calculation  OT Start Time (ACUTE ONLY) 1133    1534  OT Stop Time (ACUTE ONLY) 1150    1545  OT Time Calculation (min) 17 min   12 min  OT General Charges  $OT Visit (2 visits)  OT Evaluation  $OT Eval Low Complexity 1 Low  OT Treatments  $Therapeutic Activity 8-22 mins  Written Expression  Dominant Hand Right  [facility], OT/L  [phone] 12/21/2017

## 2017-12-21 NOTE — Care Management Note (Signed)
Case Management Note  Patient Details  Name: Melissa Krueger MRN: [id number] Date of Birth: 09-27-1976  Subjective/Objective:                    Action/Plan: Pt discharging home with orders for [facility] services. CM met with the patient and provided choice. She selected [facility]. They were not able to accept the referral.  [facility] was able to see the patient. CM spoke to [place] with [facility] and faxed the information requested. Pt aware and happy with the change.  Pt has transportation home.   Expected Discharge Date:  12/21/17               Expected Discharge Plan:  [place]  In-House Referral:     Discharge planning Services  CM Consult  Post Acute Care Choice:  Durable Medical Equipment, Home Health Choice offered to:  Patient  DME Arranged:  [place] [facility] D[facility] Agency:  [place] Arranged:  PT, OT [facility] Agency:  [facility]  Status of Service:  Completed, signed off  If discussed at [place] of Stay Meetings, dates discussed:    Additional Comments:  Pollie Friar, RN 12/21/2017, 3:56 PM

## 2017-12-21 NOTE — Progress Notes (Signed)
Physical Therapy Treatment Patient Details Name: Melissa Krueger MRN: [id number] DOB: 07/07/1976 Today's Date: 12/21/2017    History of Present Illness  42 y.o. female presenting with left-sided weakness and dizziness. PMH is significant for hypertension, history of pulmonary embolism, tobacco use, obesity. CT head that was negative. MRI revealed Acute lacunar infarct in the right thalamus. as well as Absent or abnormal flow in the right vertebral artery beginning in the neck and Distal right P2 high-grade narrowing.    PT Comments    Pt is making good progress towards her goals, however is still challenged by higher level balance activities. Pt is mod I with bed mobility and transfer and continues to require min guard for ambulation. Pt educated on need to have someone with her when she is getting around in her home at discharge.    Follow Up Recommendations  Home health PT;Supervision/Assistance - 24 hour     Equipment Recommendations  Rolling walker with 5" wheels    Recommendations for Other Services       Precautions / Restrictions Precautions Precautions: Fall Restrictions Weight Bearing Restrictions: No    Mobility  Bed Mobility Overal bed mobility: Modified Independent                Transfers Overall transfer level: Modified independent               General transfer comment: good power up and steadying in standing   Ambulation/Gait Ambulation/Gait assistance: Min guard Ambulation Distance (Feet): 400 Feet Assistive device: None Gait Pattern/deviations: Step-through pattern Gait velocity: slow  Gait velocity interpretation: Below normal speed for age/gender General Gait Details: min guard for safety , mild instability with higher level balance activities,    Modified Rankin (Stroke Patients Only) Modified Rankin (Stroke Patients Only) Pre-Morbid Rankin Score: No symptoms Modified Rankin: No significant disability     Balance Overall  balance assessment: Needs assistance Sitting-balance support: No upper extremity supported;Feet supported Sitting balance-Leahy Scale: Good     Standing balance support: No upper extremity supported;During functional activity Standing balance-Leahy Scale: Fair Standing balance comment: decreased stability with reaching outside BoS             High level balance activites: Head turns;Sudden stops;Turns;Direction changes High Level Balance Comments: minor instability with head turn up and down, and with quick turns, pt educated on need to be careful with activities that challenge her balance when she gets home.            Cognition Arousal/Alertness: Awake/alert Behavior During Therapy: WFL for tasks assessed/performed Overall Cognitive Status: Within Functional Limits for tasks assessed                                               Pertinent Vitals/Pain Pain Assessment: No/denies pain    Home Living Family/patient expects to be discharged to:: Private residence Living Arrangements: Children Available Help at Discharge: Family;Available PRN/intermittently Type of Home: House Home Access: Level entry   Home Layout: Two level;Able to live on main level with bedroom/bathroom Home Equipment: Hand held shower head      Prior Function Level of Independence: Independent      Comments: works two jobs, independent in everything   PT Goals (current goals can now be found in the care plan section) Acute Rehab PT Goals Patient Stated Goal: get back to work PT Goal Formulation:  With patient Time For Goal Achievement: 01/03/18 Potential to Achieve Goals: Good    Frequency    Min 4X/week      PT Plan Current plan remains appropriate       AM-PAC PT "6 Clicks" Daily Activity  Outcome Measure  Difficulty turning over in bed (including adjusting bedclothes, sheets and blankets)?: None Difficulty moving from lying on back to sitting on the side of  the bed? : None Difficulty sitting down on and standing up from a chair with arms (e.g., wheelchair, bedside commode, etc,.)?: None Help needed moving to and from a bed to chair (including a wheelchair)?: None Help needed walking in hospital room?: A Little Help needed climbing 3-5 steps with a railing? : None 6 Click Score: 23    End of Session Equipment Utilized During Treatment: Gait belt Activity Tolerance: Patient tolerated treatment well Patient left: in bed;with call bell/phone within reach;with bed alarm set;with family/visitor present Nurse Communication: Mobility status PT Visit Diagnosis: Unsteadiness on feet (R26.81);Other abnormalities of gait and mobility (R26.89);Difficulty in walking, not elsewhere classified (R26.2);Dizziness and giddiness (R42);Other symptoms and signs involving the nervous system (Z61.096)     Time: 0454-0981 PT Time Calculation (min) (ACUTE ONLY): 17 min  Charges:  $Gait Training: 8-22 mins                    G Codes:       Latonya Nelon B. Beverely Risen PT, DPT Acute Rehabilitation  [phone] Pager [phone]     Elon Alas Fleet 12/21/2017, 2:38 PM

## 2017-12-21 NOTE — Progress Notes (Signed)
No insurance listed for the patient. She has Medicaid. Pt able to provide card. Copy made and faxed to financial counseling. Pt states she will need a walker for home and is interested in [facility]. CM provided her choice and she is going to decide. CM following.

## 2017-12-21 NOTE — Progress Notes (Signed)
Nurse went over discharge with Patient and family. Patient and family verbalized understanding of discharge. All questions and concerns addressed.   Discharging home with all belongings.  Patient left ambulatory.

## 2017-12-22 LAB — SICKLE CELL SCREEN: Sickle Cell Screen: NEGATIVE

## 2017-12-22 LAB — ANTI-DNA ANTIBODY, DOUBLE-STRANDED: ds DNA Ab: <1 IU/mL (ref 0–9)

## 2017-12-24 LAB — ANTINUCLEAR ANTIBODIES, IFA: ANA Ab, IFA: NEGATIVE

## 2017-12-26 ENCOUNTER — Inpatient Hospital Stay: Payer: Medicaid Other | Admitting: Family Medicine

## 2018-01-10 LAB — ALPHA GALACTOSIDASE

## 2018-02-19 ENCOUNTER — Encounter: Payer: Self-pay | Admitting: Neurology

## 2018-02-19 ENCOUNTER — Ambulatory Visit: Payer: Self-pay | Admitting: Neurology

## 2018-06-30 ENCOUNTER — Encounter (HOSPITAL_COMMUNITY): Payer: Self-pay | Admitting: Emergency Medicine

## 2018-06-30 ENCOUNTER — Emergency Department (HOSPITAL_COMMUNITY)
Admission: EM | Admit: 2018-06-30 | Discharge: 2018-06-30 | Disposition: A | Discharge status: Home / Self Care | Payer: Medicaid Other | Attending: Emergency Medicine | Admitting: Emergency Medicine

## 2018-06-30 DIAGNOSIS — F17210 Nicotine dependence, cigarettes, uncomplicated: Secondary | ICD-10-CM | POA: Insufficient documentation

## 2018-06-30 DIAGNOSIS — K047 Periapical abscess without sinus: Secondary | ICD-10-CM | POA: Insufficient documentation

## 2018-06-30 DIAGNOSIS — Z7902 Long term (current) use of antithrombotics/antiplatelets: Secondary | ICD-10-CM | POA: Insufficient documentation

## 2018-06-30 DIAGNOSIS — I10 Essential (primary) hypertension: Secondary | ICD-10-CM | POA: Insufficient documentation

## 2018-06-30 DIAGNOSIS — Z7982 Long term (current) use of aspirin: Secondary | ICD-10-CM | POA: Insufficient documentation

## 2018-06-30 MED ORDER — CLINDAMYCIN HCL 300 MG PO CAPS: 300.0000 mg | ORAL_CAPSULE | Freq: Four times a day (QID) | ORAL | 0 refills | Status: DC

## 2018-06-30 NOTE — ED Triage Notes (Addendum)
Pt reports she began to experience facial (jaw area) swelling yesterday around 4pm which has continued to get worse.  This has happened before, gums hurt if she touches them.  No trouble breathing or swallowing.

## 2018-06-30 NOTE — ED Provider Notes (Signed)
[facility]   CSN: [id number] Arrival date & time: 06/30/18  1909     History   Chief Complaint Chief Complaint  Patient presents with  . Facial Swelling  . Dental Problem    HPI Melissa LenzJosephine Krueger is a 42 y.o. female.  The history is provided by the patient. No language interpreter was used.  Dental Pain   This is a new problem. The current episode started 2 days ago. The problem occurs hourly. The problem has been gradually worsening. The pain is moderate. She has tried nothing for the symptoms. The treatment provided no relief.   Pt complains of swelling to the right side of her face.  Pt reports she had a tooth break 2 weeks ago.   Past Medical History:  Diagnosis Date  . Anemia   . Daily headache   . DVT (deep venous thrombosis) (HCC) ~ 2014  . Heart murmur    "born w/one"  . Hypertension   . Menorrhagia   . Obesity   . Pulmonary embolism (HCC) ~ 2014  . Stroke (HCC) 12/19/2017   "they called it a light stroke; left face; LUE, LLE numb/weaker" (12/19/2017)  . Syncope     Patient Active Problem List   Diagnosis Date Noted  . Cerebral embolism with cerebral infarction 12/20/2017  . Vertebral artery dissection (HCC)   . History of pulmonary embolism   . Mild tetrahydrocannabinol (THC) abuse   . Left-sided weakness 12/19/2017  . Hyponatremia 04/04/2015  . Pulmonary embolism (HCC) 04/04/2015  . Chest pain   . Hypokalemia   . Acute pulmonary embolism (HCC)   . Pulmonary embolus (HCC) 04/03/2015  . Murmur 07/08/2014  . Menorrhagia 06/16/2014  . Healthcare maintenance 06/16/2014  . Syncope 05/25/2014  . Well woman exam 10/05/2011  . Morbid obesity (HCC) 10/05/2011  . Smoker 08/28/2009  . Iron deficiency anemia 01/17/2007  . HYPERTENSION, BENIGN SYSTEMIC 01/17/2007    Past Surgical History:  Procedure Laterality Date  . REDUCTION MAMMAPLASTY  1996  . TUBAL LIGATION  2007     OB History   None       Home Medications    Prior to Admission medications   Medication Sig Start Date End Date Taking? Authorizing Provider  acetaminophen (TYLENOL) 325 MG tablet Take 2 tablets (650 mg total) by mouth every 6 (six) hours as needed for mild pain (or Fever >/= 101). Patient not taking: Reported on 12/19/2017 04/04/15   Alison Murrayevine, Alma M, MD  aspirin 325 MG EC tablet Take 1 tablet (325 mg total) by mouth daily. 12/22/17   Arlyce HarmanLockamy, Timothy, DO  atorvastatin (LIPITOR) 80 MG tablet Take 1 tablet (80 mg total) by mouth daily at 6 PM. 12/21/17   Lockamy, Timothy, DO  clindamycin (CLEOCIN) 300 MG capsule Take 1 capsule (300 mg total) by mouth every 6 (six) hours. 06/30/18   Elson AreasSofia, Leslie K, PA-C  clopidogrel (PLAVIX) 75 MG tablet Take 1 tablet (75 mg total) by mouth daily. 12/22/17   Arlyce HarmanLockamy, Timothy, DO  ferrous sulfate 325 (65 FE) MG tablet Take 1 tablet (325 mg total) by mouth daily with breakfast. Patient not taking: Reported on 12/19/2017 05/29/14   Elenora GammaBradshaw, Samuel L, MD  lisinopril-hydrochlorothiazide (PRINZIDE,ZESTORETIC) 20-25 MG per tablet Take 1 tablet by mouth daily. Patient not taking: Reported on 12/19/2017 04/16/15   Elenora GammaBradshaw, Samuel L, MD  traMADol (ULTRAM) 50 MG tablet Take 1-2 tablets (50-100 mg total) by mouth every 6 (six) hours as needed. Patient  not taking: Reported on 12/19/2017 02/24/16   Earley Favor, NP  vitamin C (ASCORBIC ACID) 500 MG tablet Take 500 mg by mouth daily.    [provider]    Family History Family History  Problem Relation Age of Onset  . Hypertension Mother   . Hyperlipidemia Mother   . Diabetes Father   . Alcohol abuse Father   . Lupus Sister     Social History Social History   Tobacco Use  . Smoking status: Current Every Day Smoker    Packs/day: 0.40    Years: 20.00    Pack years: 8.00    Types: Cigarettes  . Smokeless tobacco: Never Used  Substance Use Topics  . Alcohol use: Yes    Comment: 12/19/2017 "might drink twice/month; couple drinks each  time"  . Drug use: Yes    Types: Marijuana    Comment: 12/19/2017 "daily"     Allergies   Patient has no known allergies.   Review of Systems Review of Systems  All other systems reviewed and are negative.    Physical Exam Updated Vital Signs BP 134/88 (BP Location: Right Arm)   Pulse (!) 101   Temp 99 F (37.2 C) (Oral)   Resp 18   Ht 5\' 7"  (1.702 m)   Wt 103 kg   LMP 06/28/2018 (Exact Date)   SpO2 100%   BMI 35.55 kg/m   Physical Exam  Constitutional: She appears well-developed and well-nourished.  HENT:  Head: Normocephalic.  Swollen right cheek, swollen gumline   Eyes: Pupils are equal, round, and reactive to light. EOM are normal.  Cardiovascular: Normal rate.  Pulmonary/Chest: Effort normal.  Abdominal: Soft.  Musculoskeletal: Normal range of motion.  Neurological: She is alert.  Skin: Skin is warm.  Psychiatric: She has a normal mood and affect.  Nursing note and vitals reviewed.    ED Treatments / Results  Labs (all labs ordered are listed, but only abnormal results are displayed) Labs Reviewed - No data to display  EKG None  Radiology No results found.  Procedures Procedures (including critical care time)  Medications Ordered in ED Medications - No data to display   Initial Impression / Assessment and Plan / ED Course  I have reviewed the triage vital signs and the nursing notes.  Pertinent labs & imaging results that were available during my care of the patient were reviewed by me and considered in my medical decision making (see chart for details).      Pt advised to follow up with Dr. Michiel Sites dentist on call Final Clinical Impressions(s) / ED Diagnoses   Final diagnoses:  Dental infection    ED Discharge Orders         Ordered    clindamycin (CLEOCIN) 300 MG capsule  Every 6 hours     06/30/18 2029        An After Visit Summary was printed and given to the patient.   Osie Cheeks 06/30/18 2031    Sabas Sous, MD 07/01/18 [phone]

## 2018-06-30 NOTE — ED Notes (Signed)
Patient Alert and oriented to baseline. Stable and ambulatory to baseline. Patient verbalized understanding of the discharge instructions.  Patient belongings were taken by the patient.   

## 2018-06-30 NOTE — ED Notes (Signed)
Patient is A&Ox4 at this time.  Patient in no signs of distress.  Please see providers note for complete history and physical exam.  

## 2020-06-30 ENCOUNTER — Encounter (HOSPITAL_COMMUNITY): Payer: Self-pay

## 2020-06-30 ENCOUNTER — Emergency Department (HOSPITAL_COMMUNITY)
Admission: EM | Admit: 2020-06-30 | Discharge: 2020-06-30 | Disposition: A | Discharge status: Home / Self Care | Payer: No Typology Code available for payment source | Attending: Emergency Medicine | Admitting: Emergency Medicine

## 2020-06-30 ENCOUNTER — Emergency Department (HOSPITAL_COMMUNITY): Payer: No Typology Code available for payment source

## 2020-06-30 DIAGNOSIS — R079 Chest pain, unspecified: Secondary | ICD-10-CM | POA: Diagnosis not present

## 2020-06-30 DIAGNOSIS — F17210 Nicotine dependence, cigarettes, uncomplicated: Secondary | ICD-10-CM | POA: Diagnosis not present

## 2020-06-30 DIAGNOSIS — S62343D Nondisplaced fracture of base of third metacarpal bone, left hand, subsequent encounter for fracture with routine healing: Secondary | ICD-10-CM | POA: Diagnosis not present

## 2020-06-30 DIAGNOSIS — M25532 Pain in left wrist: Secondary | ICD-10-CM | POA: Diagnosis present

## 2020-06-30 DIAGNOSIS — Z79899 Other long term (current) drug therapy: Secondary | ICD-10-CM | POA: Insufficient documentation

## 2020-06-30 DIAGNOSIS — Z7982 Long term (current) use of aspirin: Secondary | ICD-10-CM | POA: Diagnosis not present

## 2020-06-30 DIAGNOSIS — I10 Essential (primary) hypertension: Secondary | ICD-10-CM | POA: Insufficient documentation

## 2020-06-30 MED ORDER — OXYCODONE HCL 5 MG PO TABS: 5.0000 mg | ORAL_TABLET | ORAL | 0 refills | Status: AC | PRN

## 2020-06-30 MED ORDER — IBUPROFEN 800 MG PO TABS
800.0000 mg | ORAL_TABLET | Freq: Once | ORAL | Status: AC
  Administered 2020-06-30: 22:00:00 800 mg via ORAL
  Filled 2020-06-30: qty 1

## 2020-06-30 MED ORDER — OXYCODONE-ACETAMINOPHEN 5-325 MG PO TABS
1.0000 | ORAL_TABLET | Freq: Once | ORAL | Status: AC
  Administered 2020-06-30: 22:00:00 1 via ORAL
  Filled 2020-06-30: qty 1
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
## 2020-06-30 NOTE — Progress Notes (Signed)
Orthopedic Tech Progress Note Patient Details:  Melissa Krueger 1976/04/25 [id number]  Ortho Devices Type of Ortho Device: Ace wrap, Volar splint Ortho Device/Splint Location: LUE Ortho Device/Splint Interventions: Ordered, Application   Post Interventions Patient Tolerated: Well Instructions Provided: Care of device   Jennye Moccasin 06/30/2020, 9:34 PM

## 2020-06-30 NOTE — Discharge Instructions (Addendum)
You should schedule a follow-up appointment with Dr. Melvyn Novas who is a hand doctor.  You have a fracture at the bottom of your left third finger.  We put you in a splint to help stabilize this.  Please do not lift anything with your left hand until you are seen and cleared by the orthopedic surgeon.  It may take 4 to 6 weeks for this bone to heal completely.  At home you can take oxycodone for severe pain, and tylenol for mild and moderate pain.   You should avoid motrin/aleve/advil/ibuprofen (NSAIDS) if you are already taking aspirin and plavix, as these are potential blood thinners.
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
## 2020-07-06 DIAGNOSIS — S62303A Unspecified fracture of third metacarpal bone, left hand, initial encounter for closed fracture: Secondary | ICD-10-CM | POA: Diagnosis not present

## 2020-07-20 DIAGNOSIS — S62225D Nondisplaced Rolando's fracture, left hand, subsequent encounter for fracture with routine healing: Secondary | ICD-10-CM | POA: Diagnosis not present

## 2020-08-12 DIAGNOSIS — S62225D Nondisplaced Rolando's fracture, left hand, subsequent encounter for fracture with routine healing: Secondary | ICD-10-CM | POA: Diagnosis not present

## 2020-09-09 DIAGNOSIS — S62225D Nondisplaced Rolando's fracture, left hand, subsequent encounter for fracture with routine healing: Secondary | ICD-10-CM | POA: Diagnosis not present

## 2020-09-10 ENCOUNTER — Ambulatory Visit (INDEPENDENT_AMBULATORY_CARE_PROVIDER_SITE_OTHER): Payer: Medicaid Other | Admitting: Family Medicine

## 2020-09-10 ENCOUNTER — Other Ambulatory Visit: Payer: Self-pay

## 2020-09-10 ENCOUNTER — Telehealth: Payer: Self-pay | Admitting: *Deleted

## 2020-09-10 ENCOUNTER — Encounter: Payer: Self-pay | Admitting: Family Medicine

## 2020-09-10 VITALS — BP 124/76 | HR 81 | Ht 66.0 in | Wt 229.6 lb

## 2020-09-10 DIAGNOSIS — E782 Mixed hyperlipidemia: Secondary | ICD-10-CM

## 2020-09-10 DIAGNOSIS — R7303 Prediabetes: Secondary | ICD-10-CM | POA: Diagnosis not present

## 2020-09-10 DIAGNOSIS — D509 Iron deficiency anemia, unspecified: Secondary | ICD-10-CM

## 2020-09-10 DIAGNOSIS — F4321 Adjustment disorder with depressed mood: Secondary | ICD-10-CM

## 2020-09-10 DIAGNOSIS — Z1159 Encounter for screening for other viral diseases: Secondary | ICD-10-CM

## 2020-09-10 DIAGNOSIS — I10 Essential (primary) hypertension: Secondary | ICD-10-CM | POA: Diagnosis not present

## 2020-09-10 DIAGNOSIS — I63431 Cerebral infarction due to embolism of right posterior cerebral artery: Secondary | ICD-10-CM | POA: Diagnosis not present

## 2020-09-10 DIAGNOSIS — E785 Hyperlipidemia, unspecified: Secondary | ICD-10-CM | POA: Insufficient documentation

## 2020-09-10 LAB — POCT GLYCOSYLATED HEMOGLOBIN (HGB A1C): Hemoglobin A1C: 5.8 % — AB (ref 4.0–5.6)

## 2020-09-10 MED ORDER — ASPIRIN 325 MG PO TBEC: 325.0000 mg | DELAYED_RELEASE_TABLET | Freq: Every day | ORAL | 0 refills | Status: AC

## 2020-09-10 MED ORDER — ATORVASTATIN CALCIUM 80 MG PO TABS: 80.0000 mg | ORAL_TABLET | Freq: Every day | ORAL | 0 refills | Status: AC

## 2020-09-10 MED ORDER — SERTRALINE HCL 50 MG PO TABS: 50.0000 mg | ORAL_TABLET | Freq: Every day | ORAL | 3 refills | Status: AC

## 2020-09-10 MED ORDER — FERROUS SULFATE 325 (65 FE) MG PO TABS: 325.0000 mg | ORAL_TABLET | Freq: Every day | ORAL | 3 refills | Status: AC

## 2020-09-10 NOTE — Progress Notes (Signed)
SUBJECTIVE:   Chief compliant/HPI: annual examination  Melissa Krueger is a 44 y.o. who presents today for an annual exam, however given elevated PHQ-9 score, visit transitioned to focused visit.   PMH: h/o pulmonary embolism, cerebral embolism w/ cerebral infarction, HTN, vertebral artery dissection, iron deficiency anemia, morbid obesity, THC use, tobacco use, murmur  Elevated PHQ-9: Patient was noted to have an elevated PHQ-9 score of 15 with answer of 1 to question #9.  She notes that she has had a lot going on over the last few months.  Since her stroke she is only been able to work one job thus she has been unable to afford rent and her hours have been significantly cut at her single job.  She is currently staying at her sister's house with 3 of her children.  She has 2 of her children staying at their father's house because she does not have enough room for them.  She has had passive thoughts of suicide with plan to overdose on pills.  She did have 1 attempt where she took 8 pills of Tylenol for headache about 1.5 months ago  She notes her protective measures are her children.  She denies any active SI/HI at this time.  Denies any history of bipolar disease.  She struggles with finding a home in [facility] [place] that she can afford it is difficult for her to look outside in [place] given lack of transportation and driver's license.   PreDiabetes: Last three A1C's below. Currently diet controlled. Denies any polyuria, polydipsia, polyphagia.  Lab Results  Component Value Date   HGBA1C 5.8 (A) 09/10/2020   HGBA1C 5.9 (H) 12/20/2017    HTN:  BP: 124/76 today. Currently on Lisinopril-HCTZ 20-25mg QD however has not been taking as she has not had insurance to afford medications. Current everyday smoker.   HLD: Last lipid panel below. Currently on Atorvastatin 80mg  QD, however has not been on any medications as she just got insurance again.  Lab Results  Component  Value Date   CHOL 214 (H) 09/10/2020   HDL 31 (L) 09/10/2020   LDLCALC 162 (H) 09/10/2020   TRIG 117 09/10/2020   CHOLHDL 6.9 (H) 09/10/2020    Health Maintenance: Health Maintenance Due  Topic  . COVID-19 Vaccine (1)  . PAP SMEAR-Modifier   . INFLUENZA VACCINE     No family history of breast or colon cancer to suggest early screening.  Review of systems: See HPI  OBJECTIVE:   BP 124/76   Pulse 81   Ht 5\' 6"  (1.676 m)   Wt 229 lb 9.6 oz (104.1 kg)   SpO2 100%   BMI 37.06 kg/m   General: Pleasant middle-aged woman, sitting comfortably in exam chair, in no acute distress with non-toxic appearance, very tearful throughout exam Resp: breathing comfortably on room air, speaking in full sentences MSK:  gait normal Neuro: Alert and oriented, speech normal  ASSESSMENT/PLAN:   Cerebral embolism with cerebral infarction Has not been taking ASA.  - patient instructed to restart ASA 325mg   HYPERTENSION, BENIGN SYSTEMIC Well controlled. Has not been taking antihypertensives given lack of insurance. She just received Medicaid.  - will hold off on restarting Lisinopril-HCTZ given normotensive during encounter. - consider restarting or starting monotherapy if becomes elevated at follow up visits - BMP obtained with stable and normal kidney function - encouraged smoking cessation. Plan to further discuss when acute concerns are more stable  Hyperlipidemia Lipid panel obtained today. Elevated LDL of 162 (goal  of <70).  - restart Atorvastatin 80mg  QD - repeat LDL in 3-6 months to evaluate for improvement and compliance  Iron deficiency anemia History of iron deficiency anemia. Has not been taking iron supplement.  CBC obtained with Hgb 11.1, MCV of 72.  Denies any acute bleed Restart iron supplement  Prediabetes A1C stable at 5.8. - encouraged continued lifsteyle changes, focused on activity and healthy eating  Situational depression Elevated PHQ-9 score with positive  answer to Q#9. History of passive SI with plan and attempt. Does have protective measures and often states "just feel it would be easier if I wasn't here". She denies any active SI/HI currently. She is open to therapy and medication.  - list of therapy resources provided for patient to call and establish care - Start Zoloft 50mg  QD. Instructed to increase to 100mg  in 1-2 weeks if tolerating but no improvement in symptoms - follow up in 2 weeks with me - referral to CCM for community resources - Safety plan discussed with the patient. They are aware of how to contact crisis services if need be and instructed to go to the nearest emergency room if they feel they are in imminent danger of harming themselves and or others, or symptoms are of out control or unbearable.    Health Maintenance: Due for flu and covid vaccine Due for papsmear Plan to discuss at follow up visit given acute concerns today  , DO [facility], PGY3 09/13/2020 12:47 PM

## 2020-09-10 NOTE — Patient Instructions (Addendum)
It was a pleasure to see you today!  Thank you for choosing [facility] for your primary care.  Darla Mcdonald was seen for depression, med check.   Our plans for today were:  Please restart Lipitor (cholesterol med), Aspirin, and iron supplement.  Please start Zoloft 50mg : take 1 tablet once a day. In one week you may increase to 2 tablets day.  Below is a list of therapists that take medicaid. Please reach out at your convenience to schedule.   You should return to our clinic in 2 weeks for follow up.   Best Wishes,   , DO   Therapy and Counseling Resources Most providers on this list will take Medicaid. Patients with commercial insurance or Medicare should contact their insurance company to get a list of in network providers.  [facility] [address], [address]  [place], [place]       [phone]     [facility] Psychological Services [address] [place], [place]  [place]    [phone] [facility] [address], [place], [place]  [facility]:  [address] [place]  [facility]:  [address], [place]  [facility] (under & uninsured) [address]   [place]    [email or web address]    [facility] [place] -[address]     Phone:  [phone]     [facility]-  [address]  [phone]   [facility] [address]      [place], [place] ext 1001  [facility]: [address] [place], [place], [place]   [facility] (Spanish therapist) [address] [place]  [address]   [place]    [phone]    [facility]   [phone][address],  [place], [place]   [facility]  [address] [place]  [facility]  [address] [place], [place]        [phone]  [facility] Clinic under & uninsured [place],  [address] [phone]):  Mon - Fri from 8 AM - 3 PM  [facility] 12-05-1990,  (Spanish)   [address], [place]: [phone]) 8:30 - 12; 1 - 2:30  [facility] the ([phone],  [address], [place]    [phone]):8:30 - 12; 2 - 3PM  [facility] [place],  [address]  [place]; [phone]):   Mon - Fri 8 AM - 5 PM  [facility] [address] [place]  MWF 12:30 to 3:00 or call to schedule an appointment  [phone]  Specific Provider options Psychology Today  https://www.psychologytoday.com/us 1. click on find a therapist  2. enter your zip code 3. left side and select or tailor a therapist for your specific need.   [facility] Provider Directory [email or web address]  (Medicaid)   Follow all drop down to find a provider  Social Support program [facility] [place] [phone] or [phone] [address], [place], [place] Recovery support and educational   In home counseling [facility] Telephone: [phone]  office in [place] [email or web address]   Does not take reg. Medicaid or Medicare private insurance Home, [place]  health Choice, [facility], [employer], [place], [place], [place] Health Choice  24- Hour Availability:  . [facility]   [phone] or [phone]  . [facility] [phone]  [facility] Crisis Service  [phone]   . [facility] [employer]  [phone] (after hours)  . Therapeutic Alternative/Mobile Crisis   [phone]  . [place] National Suicide Hotline  7574941833 (TALK)  . Call 911 or go to emergency room  . [employer]  [phone]);  [place] and [employer]    . [employer] ACCESS  [phone]); [place], [place], [place], [place], [place], [place], [place]

## 2020-09-10 NOTE — Chronic Care Management (AMB) (Signed)
   Care Management   Outreach Note  09/10/2020 Name: Melissa Krueger MRN: [id number] DOB: [birth year]  Melissa Krueger is a 44 y.o. year old female who is a primary care patient of Joana Reamer, DO. I reached out to [employer] by phone today in response to a referral sent by Ms. Sharma Bodey's PCP, Mullis, Kiersten P, DO     An unsuccessful telephone outreach was attempted today. The patient was referred to the case management team for assistance with care management and care coordination.   Follow Up Plan: A HIPAA compliant phone message was left for the patient providing contact information and requesting a return call. The care management team will reach out to the patient again over the next 7 days. If patient returns call to provider office, please advise to call Embedded Care Management Care Guide Melissa Krueger at [phone].  Melissa Krueger  Care Guide, Embedded Care Coordination [facility] Management

## 2020-09-11 LAB — BASIC METABOLIC PANEL
BUN/Creatinine Ratio: 7 — ABNORMAL LOW (ref 9–23)
BUN: 6 mg/dL (ref 6–24)
CO2: 22 mmol/L (ref 20–29)
Calcium: 9 mg/dL (ref 8.7–10.2)
Chloride: 105 mmol/L (ref 96–106)
Creatinine, Ser: 0.83 mg/dL (ref 0.57–1.00)
GFR calc Af Amer: 101 mL/min/{1.73_m2} (ref >59)
GFR calc non Af Amer: 88 mL/min/{1.73_m2} (ref >59)
Glucose: 87 mg/dL (ref 65–99)
Potassium: 4.1 mmol/L (ref 3.5–5.2)
Sodium: 140 mmol/L (ref 134–144)

## 2020-09-11 LAB — CBC
Hematocrit: 36.9 % (ref 34.0–46.6)
Hemoglobin: 11.1 g/dL (ref 11.1–15.9)
MCH: 21.6 pg — ABNORMAL LOW (ref 26.6–33.0)
MCHC: 30.1 g/dL — ABNORMAL LOW (ref 31.5–35.7)
MCV: 72 fL — ABNORMAL LOW (ref 79–97)
Platelets: 367 10*3/uL (ref 150–450)
RBC: 5.15 x10E6/uL (ref 3.77–5.28)
RDW: 20.9 % — ABNORMAL HIGH (ref 11.7–15.4)
WBC: 9.8 10*3/uL (ref 3.4–10.8)

## 2020-09-11 LAB — LIPID PANEL
Chol/HDL Ratio: 6.9 ratio — ABNORMAL HIGH (ref 0.0–4.4)
Cholesterol, Total: 214 mg/dL — ABNORMAL HIGH (ref 100–199)
HDL: 31 mg/dL — ABNORMAL LOW (ref >39)
LDL Chol Calc (NIH): 162 mg/dL — ABNORMAL HIGH (ref 0–99)
Triglycerides: 117 mg/dL (ref 0–149)
VLDL Cholesterol Cal: 21 mg/dL (ref 5–40)

## 2020-09-11 LAB — HEPATITIS C ANTIBODY: Hep C Virus Ab: <0.1 s/co ratio (ref 0.0–0.9)

## 2020-09-13 ENCOUNTER — Encounter: Payer: Self-pay | Admitting: Family Medicine

## 2020-09-13 DIAGNOSIS — F4321 Adjustment disorder with depressed mood: Secondary | ICD-10-CM | POA: Insufficient documentation

## 2020-09-13 DIAGNOSIS — R7303 Prediabetes: Secondary | ICD-10-CM | POA: Insufficient documentation

## 2020-09-13 NOTE — Assessment & Plan Note (Signed)
Elevated PHQ-9 score with positive answer to Q#9. History of passive SI with plan and attempt. Does have protective measures and often states "just feel it would be easier if I wasn't here". She denies any active SI/HI currently. She is open to therapy and medication.  - list of therapy resources provided for patient to call and establish care - Start Zoloft 50mg  QD. Instructed to increase to 100mg  in 1-2 weeks if tolerating but no improvement in symptoms - follow up in 2 weeks with me - referral to CCM for community resources - Safety plan discussed with the patient. They are aware of how to contact crisis services if need be and instructed to go to the nearest emergency room if they feel they are in imminent danger of harming themselves and or others, or symptoms are of out control or unbearable.

## 2020-09-13 NOTE — Assessment & Plan Note (Addendum)
Well controlled. Has not been taking antihypertensives given lack of insurance. She just received Medicaid.  - will hold off on restarting Lisinopril-HCTZ given normotensive during encounter. - consider restarting or starting monotherapy if becomes elevated at follow up visits - BMP obtained with stable and normal kidney function - encouraged smoking cessation. Plan to further discuss when acute concerns are more stable

## 2020-09-13 NOTE — Assessment & Plan Note (Signed)
Lipid panel obtained today. Elevated LDL of 162 (goal of <70).  - restart Atorvastatin 80mg  QD - repeat LDL in 3-6 months to evaluate for improvement and compliance

## 2020-09-13 NOTE — Assessment & Plan Note (Signed)
Has not been taking ASA.  - patient instructed to restart ASA 325mg 

## 2020-09-13 NOTE — Assessment & Plan Note (Signed)
History of iron deficiency anemia. Has not been taking iron supplement.  CBC obtained with Hgb 11.1, MCV of 72.  Denies any acute bleed Restart iron supplement

## 2020-09-13 NOTE — Assessment & Plan Note (Signed)
A1C stable at 5.8. - encouraged continued lifsteyle changes, focused on activity and healthy eating
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
## 2020-09-17 NOTE — Chronic Care Management (AMB) (Signed)
  Care Management   Note  09/17/2020 Name: Betsie Peckman MRN: [id number] DOB: 1976-06-13  Marcell Pfeifer is a 44 y.o. year old female who is a primary care patient of Joana Reamer, DO. I reached out to [employer] by phone today in response to a referral sent by Ms. Natilee Pereda's health plan.    Ms. Cedar was given information about care management services today including:  1. Care management services include personalized support from designated clinical staff supervised by her physician, including individualized plan of care and coordination with other care providers 2. 24/7 contact phone numbers for assistance for urgent and routine care needs. 3. The patient may stop care management services at any time by phone call to the office staff.  Patient agreed to services and verbal consent obtained.   Follow up plan: Telephone appointment with care management team member scheduled for: 09/22/2020  [facility] Guide, Embedded Care Coordination [facility] Management

## 2020-09-22 ENCOUNTER — Ambulatory Visit: Payer: Medicaid Other | Admitting: Licensed Clinical Social Worker

## 2020-09-22 DIAGNOSIS — Z139 Encounter for screening, unspecified: Secondary | ICD-10-CM

## 2020-09-22 DIAGNOSIS — F4321 Adjustment disorder with depressed mood: Secondary | ICD-10-CM

## 2020-09-22 NOTE — Chronic Care Management (AMB) (Signed)
Social Work  Care Management  Behavioral Health Assessment  09/22/2020 Name: Melissa Krueger MRN: [id number] DOB: 1975/11/28 Melissa Krueger is a 44 y.o. year old female who sees [place], Dara Lords, DO for primary care.  LCSW was consulted to assess mental health needs and assist the patient with  Transportation Needs  and [employer] ,    Presenting issue / symptoms/concerns: see phq-9 Duration of symptoms/ how impacting : on and off for 2 years Recent life changes: health concerns, housing concerns and transportation barriers Family / Social support: support from family; currently living with sister.   Psychiatric History - Diagnoses: adjustment disorder, situational depression - Hospitalizations/ prior attempts:  Reports 2 attempts in past 6 months - Pharmacotherapy: started zoloft but discontinued as it made her sick - Outpatient therapy: none  Assessment: Assessed thoughts of SI, plan and access to means.Patient is currently experiencing symptoms of depression which seems to be exacerbated by life stressors. List of counselors provided by PCP patient has not reviewed them.  See Care Plan for related entries.  Recommendation: Patient may benefit from is not in agreement to counseling at this time.  Willing to consider at next encounter Plan: Patient would like continued follow-up.  LCSW will f/u with patient in 1 week  Patient Care Plan: Depression (Adult)  Problem Identified: symptoms of Depression Identification   Goal: Reduce and manage Depressive Symptoms   Start Date: 09/22/2020  Expected End Date: 11/19/2020  Priority: High  Current Barriers: Patient is experiencing symptoms of depression which seem to be exacerbated by life stressors. She has reservations with connecting for ongoing counseling.    Clinical Goal(s): Over the next 60 days, patient will work with SW to reduce or manage symptoms of depression   Interventions:   Assessed patient's previous  treatment, needs, coping skills, current treatment, support and barriers to care  Provided basic mental health support, education and interventions   Discussed several options for long term counseling however patient not ready to move forward at this time  Reviewed mental health medications with patient prescribed by PCP and discussed compliance; patient reports she stopped taking medication as it made her sick' stopped taking zoloft 50mg   Other interventions include: Motivational Interviewing;Solution-Focused Strategies;Psychoeducation and/or Health Education; Emotional/Supportive Counseling Patient Activities:   implement interventions discussed today to decreases symptoms of depression and increase knowledge and/or ability of: coping skills and self-management skills  Review EMMI education information e-mailed : Breathing to Relax and Education on Depression  Counseling agencies from the list provided and we will discuss at next week   Patient Care Plan: community resouces  Problem Identified: Emotional Distress   Goal: Emotional Health Supported   Start Date: 09/22/2020  Expected End Date: 11/19/2020  Priority: High  Current Barriers: Patient needs assistance with community support for housing resources and transportation barriers Clinical Goals: assist patient with connecting to community resources to meet unmet needs Interventions:   Assessment of needs, barriers , agencies contacted, as well as how impacting     Provided patient with information about Medicaid Transportation   Discussed community options  Patient Activities:    Will call DSS to connect with Medicaid Transportation    PHQ-9 score of 15 is an indication of moderate/major symptoms of depression. Depression screen [facility] 2/9 09/10/2020 04/16/2015  Decreased Interest 1 0  Down, Depressed, Hopeless 1 0  PHQ - 2 Score 2 0  Altered sleeping 3 -  Tired, decreased energy 1 -  Change in appetite 3 -  Feeling bad or  failure about yourself  3 -  Trouble concentrating 1 -  Moving slowly or fidgety/restless 1 -  Suicidal thoughts 1 -  PHQ-9 Score 15 -     SDOH (Social Determinants of Health) assessments performed: Yes SDOH Interventions     Most Recent Value  SDOH Interventions  SDOH Interventions for the Following Domains Depression, Transportation  Transportation Interventions Other (Comment)  [Medicaid Transporation]  Depression Interventions/Treatment  Counseling       Outpatient Encounter Medications as of 09/22/2020  Medication Sig   aspirin 325 MG EC tablet Take 1 tablet (325 mg total) by mouth daily.   atorvastatin (LIPITOR) 80 MG tablet Take 1 tablet (80 mg total) by mouth daily at 6 PM.   ferrous sulfate 325 (65 FE) MG tablet Take 1 tablet (325 mg total) by mouth daily with breakfast.   lisinopril-hydrochlorothiazide (PRINZIDE,ZESTORETIC) 20-25 MG per tablet Take 1 tablet by mouth daily. (Patient not taking: Reported on 12/19/2017)   oxyCODONE (ROXICODONE) 5 MG immediate release tablet Take 1 tablet (5 mg total) by mouth every 4 (four) hours as needed for up to 12 doses for severe pain.   sertraline (ZOLOFT) 50 MG tablet Take 1 tablet (50 mg total) by mouth daily.   vitamin C (ASCORBIC ACID) 500 MG tablet Take 500 mg by mouth daily.   No facility-administered encounter medications on file as of 09/22/2020.   Review of patient status, including review of consultants reports, relevant laboratory and other test results, and collaboration with appropriate care team members and the patient's provider was performed as part of comprehensive patient evaluation and provision of care management services.     Sammuel Hines, LCSW Care Management & Coordination  [facility] / [facility]   [phone] 4:31 PM

## 2020-09-22 NOTE — Patient Instructions (Signed)
  Ms. Akamine  it was nice speaking with you. Please call me directly [phone] if you have questions about the goals we discussed. Goals Addressed            This Visit's Progress   . community resources       Patient Activities:   . Will call DSS to connect with Medicaid Transportation     . manage depression       Patient Activities:  . implement interventions discussed today to decreases symptoms of depression and increase knowledge and/or ability of: coping skills and self-management skills . Review EMMI education information e-mailed : Breathing to Relax and Education on Depression . Counseling agencies from the list provided and we will discuss at next week       Ms. Meaney received Care Management services today:  1. Care Management services include personalized support from designated clinical staff supervised by her physician, including individualized plan of care and coordination with other care providers 2. 24/7 contact [phone] for assistance for urgent and routine care needs. 3. Care Management are voluntary services and be declined at any time by calling the office.  Patient verbalizes understanding of instructions provided today.  Follow up plan: SW will follow up with patient by phone over the next week  Soundra Pilon, LCSW

## 2020-09-27 ENCOUNTER — Ambulatory Visit: Payer: Medicaid Other | Admitting: Family Medicine

## 2020-09-29 ENCOUNTER — Telehealth: Payer: Medicaid Other

## 2020-09-29 ENCOUNTER — Telehealth: Payer: Self-pay | Admitting: Licensed Clinical Social Worker

## 2020-09-29 NOTE — Chronic Care Management (AMB) (Signed)
    Clinical Social Work  Care Management Attempted Outreach   09/29/2020 Name: Melissa Krueger MRN: [id number] DOB: 05/19/1976  Melissa Krueger is a 44 y.o. year old female who is a primary care patient of Joana Reamer, DO . Phone appointment schedule with LSCW . F/U phone call to [facility] today for ongoing assessment of needs, and progress with care plan goals.  The outreach was unsuccessful.  Two HIPPA compliant phone messages were left at different times for the patient providing contact information and requesting a return call.   Plan: If no return call is received, will call again in 7 to 14 days.  Review of patient status, including review of consultants reports, relevant laboratory and other test results, and collaboration with appropriate care team members and the patient's provider was performed as part of comprehensive patient evaluation and provision of care management services.    Sammuel Hines, LCSW Care Management & Coordination  [facility] / [facility]   [phone] 12:48 PM

## 2020-10-04 ENCOUNTER — Ambulatory Visit: Payer: Medicaid Other | Admitting: Family Medicine

## 2020-10-13 ENCOUNTER — Telehealth: Payer: Self-pay | Admitting: Licensed Clinical Social Worker

## 2020-10-13 NOTE — Chronic Care Management (AMB) (Signed)
   Social Work Care Management  2nd Unsuccessful  Phone Outreach   10/13/2020 Name: Melissa Krueger MRN: [id number] DOB: October 27, 1976  Referred by: Joana Reamer, DO  Reason for referral : Care Coordination (2nd outreach)   Melissa Krueger is a 44 y.o. year old female who sees [place], Dara Lords, DO for primary care. 2nd unsuccessful telephone outreach attempt to Melissa Krueger today.  A HIPPA compliant phone message was left for the patient providing contact information and requesting a return call.  Plan: LCSW will wait for return call, if no return call is received, will reach out to Melissa Krueger again over the next 7 to 14 days. If unable to reach Melissa Krueger by phone on the 3rd attempt, will discontinue outreach calls but will be available at any time to provide services to Melissa Krueger.   Review of patient status, including review of consultants reports, relevant laboratory and other test results, and collaboration with appropriate care team members and the patient's provider was performed as part of comprehensive patient evaluation and provision of care management services.   Sammuel Hines, LCSW Care Management & Coordination  [facility] / [facility]   [phone] 8:51 AM

## 2020-10-20 ENCOUNTER — Telehealth: Payer: Self-pay | Admitting: *Deleted

## 2020-10-20 NOTE — Progress Notes (Deleted)
   Subjective:   Patient ID: Melissa Krueger    DOB: 21-Oct-1976, 44 y.o. female   MRN: [id number]  Melissa Krueger is a 44 y.o. female with a history of h/o PE, h/o CVA, HTN, h/o vertebral artery dissection, chronic left sided weakness, HLD, iron deficiency anemia, mild THC use, morbid obesity, murmur, prediabetes, situational depression, smoker here for depression and BP follow up  Situational Depression: Patient returns today for follow-up of depression related to financial difficulties and unstable home environment.  She was started on Zoloft last visit on 09/10/2020 but self discontinued because it made her feel sick.  List of counselors were provided and she notes*** History of suicidal attempt of overdose 2 to 3 months ago.  She does have protective measures which are her children. PHQ-9 score*** Denies any active SI/HI at this time.  HTN:    today. /Not currently on any antihypertensives. H/o taking Lisinopril-HCTZ but discontinued due to lack of insurance. Current everyday smoker. Denies any chest pain, SOB, vision changes, or headaches.   Health Maintenance: Health Maintenance Due  Topic  . COVID-19 Vaccine (1)  . PAP SMEAR-Modifier   . INFLUENZA VACCINE    Review of Systems:  Per HPI.   Objective:   There were no vitals taken for this visit. Vitals and nursing note reviewed.  General: pleasant ***, sitting comfortably in exam chair, well nourished, well developed, in no acute distress with non-toxic appearance HEENT: normocephalic, atraumatic, moist mucous membranes, oropharynx clear without erythema or exudate, TM normal bilaterally  Neck: supple, non-tender without lymphadenopathy CV: regular rate and rhythm without murmurs, rubs, or gallops, no lower extremity edema, 2+ radial and pedal pulses bilaterally Lungs: clear to auscultation bilaterally with normal work of breathing on room air Resp: breathing comfortably on room air, speaking in full sentences Abdomen:  soft, non-tender, non-distended, no masses or organomegaly palpable, normoactive bowel sounds Skin: warm, dry, no rashes or lesions Extremities: warm and well perfused, normal tone MSK: ROM grossly intact, strength intact, gait normal Neuro: Alert and oriented, speech normal  Assessment & Plan:   No problem-specific Assessment & Plan notes found for this encounter.  No orders of the defined types were placed in this encounter.  No orders of the defined types were placed in this encounter.   Orpah Cobb, DO PGY-3, [facility] 10/20/2020 8:59 AM

## 2020-10-20 NOTE — Chronic Care Management (AMB) (Signed)
  Care Management   Note  10/20/2020 Name: Melissa Krueger MRN: [id number] DOB: 05-02-1976  Melissa Krueger is a 44 y.o. year old female who is a primary care patient of Joana Reamer, DO and is actively engaged with the care management team. I reached out to [facility] by phone today to assist with re-scheduling a follow up visit with the Licensed Clinical Social Worker  Follow up plan: Unsuccessful telephone outreach attempt made. A HIPAA compliant phone message was left for the patient providing contact information and requesting a return call. The care management team will reach out to the patient again over the next 7 days. If patient returns call to provider office, please advise to call Embedded Care Management Care Guide Gwenevere Ghazi at [phone].  Gwenevere Ghazi  Care Guide, Embedded Care Coordination [facility] Management

## 2020-10-21 ENCOUNTER — Ambulatory Visit: Payer: Medicaid Other | Admitting: Family Medicine

## 2020-10-25 NOTE — Chronic Care Management (AMB) (Signed)
  Care Management   Note  10/25/2020 Name: Briyonna Omara MRN: [id number] DOB: [birth year]  Melissa Krueger is a 44 y.o. year old female who is a primary care patient of Joana Reamer, DO and is actively engaged with the care management team. I reached out to [facility] by phone today to assist with re-scheduling a follow up visit with the Licensed Clinical Social Worker  Follow up plan: A second unsuccessful telephone outreach attempt made. A HIPAA compliant phone message was left for the patient providing contact information and requesting a return call. The care management team will reach out to the patient again over the next 7 days. If patient returns call to provider office, please advise to call Embedded Care Management Care Guide Gwenevere Ghazi at [phone].  Gwenevere Ghazi  Care Guide, Embedded Care Coordination [facility] Management

## 2020-11-01 NOTE — Chronic Care Management (AMB) (Signed)
  Care Management   Note  11/01/2020 Name: Melissa Krueger MRN: [id number] DOB: Aug 23, 1976  Melissa Krueger is a 44 y.o. year old female who is a primary care patient of Joana Reamer, DO and is actively engaged with the care management team. I reached out to [facility] by phone today to assist with re-scheduling a follow up visit with the Licensed Clinical Social Worker  Follow up plan: A third unsuccessful telephone outreach attempt made.If patient returns call to provider office, please advise to call Embedded Care Management Care Guide Gwenevere Ghazi at [phone]. We have been unable to make contact with the patient for follow up. The care management team is available to follow up with the patient after provider conversation with the patient regarding recommendation for care management engagement and subsequent re-referral to the care management team.    [facility] Guide, Embedded Care Coordination [facility]  Care Management

## 2020-11-02 ENCOUNTER — Ambulatory Visit: Payer: Self-pay | Admitting: Licensed Clinical Social Worker

## 2020-11-02 NOTE — Chronic Care Management (AMB) (Signed)
    Clinical Social Work  Care Management Outreach   11/02/2020 Name: Melissa Krueger MRN: [id number] DOB: 1976-02-09  Melissa Krueger is a 44 y.o. year old female who is a primary care patient of Joana Reamer, DO .   Intervention: LCSW nor care guide are able to reach patient for follow up.  Plan: no further outreach scheduled. LCSW will disconnect from care team if no needs are identified in 60 to 90 days  Review of patient status, including review of consultants reports, relevant laboratory and other test results, and collaboration with appropriate care team members and the patient's provider was performed as part of comprehensive patient evaluation and provision of care management services.    Sammuel Hines, LCSW Care Management & Coordination  [facility] / [facility]   [phone] 9:37 AM

## 2021-03-03 ENCOUNTER — Ambulatory Visit (HOSPITAL_COMMUNITY)
Admission: EM | Admit: 2021-03-03 | Discharge: 2021-03-03 | Disposition: A | Discharge status: Home / Self Care | Payer: Medicaid Other | Attending: Emergency Medicine | Admitting: Emergency Medicine

## 2021-03-03 ENCOUNTER — Other Ambulatory Visit: Payer: Self-pay

## 2021-03-03 ENCOUNTER — Encounter (HOSPITAL_COMMUNITY): Payer: Self-pay

## 2021-03-03 DIAGNOSIS — M79604 Pain in right leg: Secondary | ICD-10-CM

## 2021-03-03 MED ORDER — KETOROLAC TROMETHAMINE 30 MG/ML IJ SOLN
INTRAMUSCULAR | Status: AC
  Filled 2021-03-03: qty 1

## 2021-03-03 MED ORDER — NAPROXEN 500 MG PO TABS: 500.0000 mg | ORAL_TABLET | Freq: Two times a day (BID) | ORAL | 0 refills | Status: DC

## 2021-03-03 MED ORDER — CYCLOBENZAPRINE HCL 5 MG PO TABS: 5.0000 mg | ORAL_TABLET | Freq: Every day | ORAL | 0 refills | Status: AC

## 2021-03-03 MED ORDER — KETOROLAC TROMETHAMINE 30 MG/ML IJ SOLN
30.0000 mg | Freq: Once | INTRAMUSCULAR | Status: AC
  Administered 2021-03-03: 30 mg via INTRAMUSCULAR

## 2021-03-03 NOTE — Discharge Instructions (Addendum)
Call ultrasound first thing in the morning to scheduled an appointment to be seen on 03/04/2021 to check your right leg for blood clots  Phone number [phone]  If start to feel short of breath, having chest pains, leg swelling increases, pain increases GO TO NEAREST EMERGENCY DEPARTMENT   Take naproxen twice a day for 3 days then twice a day as needed, watch for signs of increased bleeding, for example bruising easily, nose bleeds, gum bleeding while brush teeth. If this happens stop the medication   Can use muscle relaxer at bedtime as needed  Can use heating pad in 15 minutes AFTER ultrasound of leg completed with results

## 2021-03-03 NOTE — ED Provider Notes (Signed)
[facility]    CSN: [id number] Arrival date & time: 03/03/21  1537      History   Chief Complaint Chief Complaint  Patient presents with  . Leg Pain    HPI Melissa Krueger is a 45 y.o. female.   Patient presents with anterior right leg pain with swelling for four days. ROM intact. Denies skin feeling hot, calf pain, shortness of breath, chest pain, difficulty breathing. Stands on feet for work. History of pulmonary embolism and DVT. Taking 325 mg aspirin daily. Current smoker.   Past Medical History:  Diagnosis Date  . Anemia   . Daily headache   . DVT (deep venous thrombosis) (HCC) ~ 2014  . Heart murmur    "born w/one"  . Hypertension   . Menorrhagia   . Obesity   . Pulmonary embolism (HCC) ~ 2014  . Stroke (HCC) 12/19/2017   "they called it a light stroke; left face; LUE, LLE numb/weaker" (12/19/2017)  . Syncope     Patient Active Problem List   Diagnosis Date Noted  . Situational depression 09/13/2020  . Prediabetes 09/13/2020  . Hyperlipidemia 09/10/2020  . Cerebral embolism with cerebral infarction 12/20/2017  . Vertebral artery dissection (HCC)   . History of pulmonary embolism   . Mild tetrahydrocannabinol (THC) abuse   . Left-sided weakness 12/19/2017  . Pulmonary embolism (HCC) 04/04/2015  . Murmur 07/08/2014  . Morbid obesity (HCC) 10/05/2011  . Smoker 08/28/2009  . Iron deficiency anemia 01/17/2007  . HYPERTENSION, BENIGN SYSTEMIC 01/17/2007    Past Surgical History:  Procedure Laterality Date  . REDUCTION MAMMAPLASTY  1996  . TUBAL LIGATION  2007    OB History   No obstetric history on file.      Home Medications    Prior to Admission medications   Medication Sig Start Date End Date Taking? Authorizing Provider  cyclobenzaprine (FLEXERIL) 5 MG tablet Take 1 tablet (5 mg total) by mouth at bedtime. 03/03/21  Yes Derin Matthes R, NP  naproxen (NAPROSYN) 500 MG tablet Take 1 tablet (500 mg total) by mouth 2 (two) times  daily. 03/03/21  Yes Valinda Hoar, NP  aspirin 325 MG EC tablet Take 1 tablet (325 mg total) by mouth daily. 09/10/20   Mullis, Kiersten P, DO  atorvastatin (LIPITOR) 80 MG tablet Take 1 tablet (80 mg total) by mouth daily at 6 PM. 09/10/20   Mullis, Kiersten P, DO  ferrous sulfate 325 (65 FE) MG tablet Take 1 tablet (325 mg total) by mouth daily with breakfast. 09/10/20   Mullis, Kiersten P, DO  lisinopril-hydrochlorothiazide (PRINZIDE,ZESTORETIC) 20-25 MG per tablet Take 1 tablet by mouth daily. Patient not taking: No sig reported 04/16/15   Elenora Gamma, MD  oxyCODONE (ROXICODONE) 5 MG immediate release tablet Take 1 tablet (5 mg total) by mouth every 4 (four) hours as needed for up to 12 doses for severe pain. 06/30/20   Terald Sleeper, MD  sertraline (ZOLOFT) 50 MG tablet Take 1 tablet (50 mg total) by mouth daily. 09/10/20   Mullis, Kiersten P, DO  vitamin C (ASCORBIC ACID) 500 MG tablet Take 500 mg by mouth daily.    [provider]    Family History Family History  Problem Relation Age of Onset  . Hypertension Mother   . Hyperlipidemia Mother   . Diabetes Father   . Alcohol abuse Father   . Lupus Sister     Social History Social History   Tobacco Use  . Smoking  status: Current Every Day Smoker    Packs/day: 0.40    Years: 20.00    Pack years: 8.00    Types: Cigarettes  . Smokeless tobacco: Never Used  Vaping Use  . Vaping Use: Never used  Substance Use Topics  . Alcohol use: Yes    Comment: 12/19/2017 "might drink twice/month; couple drinks each time"  . Drug use: Yes    Types: Marijuana    Comment: 12/19/2017 "daily"     Allergies   Patient has no known allergies.   Review of Systems Review of Systems  Constitutional: Negative.   Respiratory: Negative.   Cardiovascular: Positive for leg swelling. Negative for chest pain and palpitations.  Musculoskeletal: Negative.   Skin: Negative.   Neurological: Negative.      Physical  Exam Triage Vital Signs ED Triage Vitals  Enc Vitals Group     BP 03/03/21 1757 (!) 145/87     Pulse Rate 03/03/21 1757 77     Resp 03/03/21 1757 18     Temp 03/03/21 1757 97.9 F (36.6 C)     Temp Source 03/03/21 1757 Oral     SpO2 03/03/21 1757 100 %     Weight --      Height --      Head Circumference --      Peak Flow --      Pain Score 03/03/21 1755 8     Pain Loc --      Pain Edu? --      Excl. in GC? --    No data found.  Updated Vital Signs BP (!) 145/87 (BP Location: Right Arm)   Pulse 77   Temp 97.9 F (36.6 C) (Oral)   Resp 18   SpO2 100%   Visual Acuity Right Eye Distance:   Left Eye Distance:   Bilateral Distance:    Right Eye Near:   Left Eye Near:    Bilateral Near:     Physical Exam Constitutional:      Appearance: Normal appearance. She is normal weight.  HENT:     Head: Normocephalic.  Eyes:     Extraocular Movements: Extraocular movements intact.  Pulmonary:     Effort: Pulmonary effort is normal.     Breath sounds: Normal breath sounds.  Musculoskeletal:     Cervical back: Normal range of motion.       Legs:     Comments: Tenderness over right lateral shin with mild swelling on lower leg, no tenderness over calf, skin warm, no difference in leg temperature, skin color appropriate for ethnicity, pulses 2+ ankle and popliteal   Skin:    General: Skin is warm and dry.  Neurological:     General: No focal deficit present.     Mental Status: She is alert and oriented to person, place, and time. Mental status is at baseline.  Psychiatric:        Mood and Affect: Mood normal.        Behavior: Behavior normal.        Thought Content: Thought content normal.        Judgment: Judgment normal.      UC Treatments / Results  Labs (all labs ordered are listed, but only abnormal results are displayed) Labs Reviewed - No data to display  EKG   Radiology No results found.  Procedures Procedures (including critical care  time)  Medications Ordered in UC Medications  ketorolac (TORADOL) 30 MG/ML injection 30 mg (has no administration  in time range)    Initial Impression / Assessment and Plan / UC Course  I have reviewed the triage vital signs and the nursing notes.  Pertinent labs & imaging results that were available during my care of the patient were reviewed by me and considered in my medical decision making (see chart for details).  Right leg pain  1. Outpatient doppler ultrasound ordered, RN called site to notify of patient, patient given number with expectation to call in morning and completion to occur on 03/04/21, verbalized understanding 2. Discussed when to seek care  3. Naproxen 500 mg bid. Discussed warning signs for increased bleeding, advised to stop medication if occurs, verbalized understanding 4. Toradol 30 mg once 5. flexeril 5 mg bedtime prn  6. Heating pad 15 minute interval after ultrasound results  Final Clinical Impressions(s) / UC Diagnoses   Final diagnoses:  Right leg pain     Discharge Instructions     Call ultrasound first thing in the morning to scheduled an appointment to be seen on 03/04/2021 to check your right leg for blood clots  Phone number [phone]  If start to feel short of breath, having chest pains, leg swelling increases, pain increases GO TO NEAREST EMERGENCY DEPARTMENT   Take naproxen twice a day for 3 days then twice a day as needed, watch for signs of increased bleeding, for example bruising easily, nose bleeds, gum bleeding while brush teeth. If this happens stop the medication   Can use muscle relaxer at bedtime as needed  Can use heating pad in 15 minutes AFTER ultrasound of leg completed with results    ED Prescriptions    Medication Sig Dispense Auth. Provider   naproxen (NAPROSYN) 500 MG tablet Take 1 tablet (500 mg total) by mouth 2 (two) times daily. 30 tablet Kristell Wooding R, NP   cyclobenzaprine (FLEXERIL) 5 MG tablet Take 1 tablet  (5 mg total) by mouth at bedtime. 10 tablet Valinda Hoar, NP     PDMP not reviewed this encounter.   Valinda Hoar, NP 03/03/21 [phone]

## 2021-03-03 NOTE — ED Notes (Signed)
Called vascular and left a message about patient having a doppler order in epic.

## 2021-03-03 NOTE — ED Triage Notes (Signed)
Pt presents with right leg pain x 4 days. Pain is worse when walking. Denies any trauma.

## 2021-03-04 ENCOUNTER — Ambulatory Visit (HOSPITAL_COMMUNITY)
Admission: RE | Admit: 2021-03-04 | Discharge: 2021-03-04 | Disposition: A | Discharge status: Home / Self Care | Payer: Medicaid Other | Source: Ambulatory Visit | Attending: Internal Medicine | Admitting: Internal Medicine

## 2021-03-04 ENCOUNTER — Telehealth (HOSPITAL_COMMUNITY): Payer: Self-pay | Admitting: Emergency Medicine

## 2021-03-04 ENCOUNTER — Other Ambulatory Visit: Payer: Self-pay

## 2021-03-04 DIAGNOSIS — M7989 Other specified soft tissue disorders: Secondary | ICD-10-CM | POA: Diagnosis not present

## 2021-03-04 DIAGNOSIS — R609 Edema, unspecified: Secondary | ICD-10-CM | POA: Diagnosis present

## 2021-03-04 NOTE — Telephone Encounter (Signed)
Attempting to place order for patient for DVT study that was cancelled so patient can have ultrasound

## 2021-03-04 NOTE — Progress Notes (Signed)
Lower extremity venous has been completed.   Preliminary results in CV Proc.   Blanch Media 03/04/2021 11:17 AM

## 2023-03-20 ENCOUNTER — Telehealth: Payer: Self-pay

## 2023-03-20 NOTE — Telephone Encounter (Signed)
LVM for patient to call back. AS, CMA 

## 2023-05-23 ENCOUNTER — Ambulatory Visit (HOSPITAL_COMMUNITY)
Admission: EM | Admit: 2023-05-23 | Discharge: 2023-05-23 | Disposition: A | Discharge status: Home / Self Care | Payer: BLUE CROSS/BLUE SHIELD | Attending: Family Medicine | Admitting: Family Medicine

## 2023-05-23 ENCOUNTER — Other Ambulatory Visit: Payer: Self-pay

## 2023-05-23 ENCOUNTER — Encounter (HOSPITAL_COMMUNITY): Payer: Self-pay | Admitting: Emergency Medicine

## 2023-05-23 DIAGNOSIS — H9202 Otalgia, left ear: Secondary | ICD-10-CM | POA: Diagnosis not present

## 2023-05-23 MED ORDER — IBUPROFEN 800 MG PO TABS: 800.0000 mg | ORAL_TABLET | Freq: Three times a day (TID) | ORAL | 0 refills | Status: AC

## 2023-05-23 MED ORDER — AMOXICILLIN 875 MG PO TABS: 875.0000 mg | ORAL_TABLET | Freq: Two times a day (BID) | ORAL | 0 refills | Status: AC

## 2023-05-23 NOTE — ED Triage Notes (Signed)
Pt c/o left ear pain for the past 2 days, getting worse today.

## 2023-05-23 NOTE — ED Provider Notes (Signed)
[facility]   [id number] 05/23/23 Arrival Time: 1318  ASSESSMENT & PLAN:  1. Acute otalgia, left    Begin: Meds ordered this encounter  Medications   amoxicillin (AMOXIL) 875 MG tablet    Sig: Take 1 tablet (875 mg total) by mouth 2 (two) times daily for 10 days.    Dispense:  20 tablet    Refill:  0   ibuprofen (ADVIL) 800 MG tablet    Sig: Take 1 tablet (800 mg total) by mouth 3 (three) times daily with meals.    Dispense:  21 tablet    Refill:  0    Follow-up Information     Celine Mans, MD.   Specialty: Family Medicine Why: If worsening or failing to improve as anticipated. Contact information: [address] [place] [address] [phone]                  Reviewed expectations re: course of current medical issues. Questions answered. Outlined signs and symptoms indicating need for more acute intervention. Patient verbalized understanding. After Visit Summary given.   SUBJECTIVE: History from: patient.  Melissa Krueger is a 47 y.o. female who presents with complaint of L ear ache; no drainage/bleeding; gradual onset x 2 days; getting worse. No significant hearing changes. No tx PTA.   Social History   Tobacco Use  Smoking Status Every Day   Packs/day: 0.40   Years: 20.00   Additional pack years: 0.00   Total pack years: 8.00   Types: Cigarettes  Smokeless Tobacco Never    OBJECTIVE:  Vitals:   05/23/23 1354  BP: (!) 145/85  Pulse: 86  Resp: 18  Temp: 97.8 F (36.6 C)  TempSrc: Oral  SpO2: 99%     General appearance: alert; appears fatigued Ear Canal: normal TM: left: erythematous, dull, bulging Neck: supple without LAD Lungs: unlabored respirations, symmetrical air entry; cough: absent; no respiratory distress Skin: warm and dry Psychological: alert and cooperative; normal mood and affect  No Known Allergies  Past Medical History:  Diagnosis Date   Anemia    Daily headache    DVT (deep venous  thrombosis) (HCC) ~ 2014   Heart murmur    "born w/one"   Hypertension    Menorrhagia    Obesity    Pulmonary embolism (HCC) ~ 2014   Stroke (HCC) 12/19/2017   "they called it a light stroke; left face; LUE, LLE numb/weaker" (12/19/2017)   Syncope    Family History  Problem Relation Age of Onset   Hypertension Mother    Hyperlipidemia Mother    Diabetes Father    Alcohol abuse Father    Lupus Sister    Social History   Socioeconomic History   Marital status: Single    Spouse name: Not on file   Number of children: Not on file   Years of education: Not on file   Highest education level: Not on file  Occupational History   Not on file  Tobacco Use   Smoking status: Every Day    Packs/day: 0.40    Years: 20.00    Additional pack years: 0.00    Total pack years: 8.00    Types: Cigarettes   Smokeless tobacco: Never  Vaping Use   Vaping Use: Never used  Substance and Sexual Activity   Alcohol use: Yes    Comment: 12/19/2017 "might drink twice/month; couple drinks each time"   Drug use: Yes    Types: Marijuana    Comment: 12/19/2017 "  daily"   Sexual activity: Not on file  Other Topics Concern   Not on file  Social History Narrative   Not on file   Social Determinants of Health   Financial Resource Strain: Not on file  Food Insecurity: Not on file  Transportation Needs: Not on file  Physical Activity: Not on file  Stress: Not on file  Social Connections: Not on file  Intimate Partner Violence: Not on file             Mardella Layman, MD 05/23/23 1444

## 2023-06-23 ENCOUNTER — Encounter (HOSPITAL_COMMUNITY): Payer: Self-pay | Admitting: *Deleted

## 2023-06-23 ENCOUNTER — Ambulatory Visit (HOSPITAL_COMMUNITY)
Admission: EM | Admit: 2023-06-23 | Discharge: 2023-06-23 | Disposition: A | Discharge status: Home / Self Care | Payer: BLUE CROSS/BLUE SHIELD | Attending: Emergency Medicine | Admitting: Emergency Medicine

## 2023-06-23 ENCOUNTER — Other Ambulatory Visit: Payer: Self-pay

## 2023-06-23 DIAGNOSIS — B349 Viral infection, unspecified: Secondary | ICD-10-CM

## 2023-06-23 MED ORDER — ONDANSETRON 4 MG PO TBDP: 4.0000 mg | ORAL_TABLET | Freq: Three times a day (TID) | ORAL | 0 refills | Status: AC | PRN

## 2023-06-23 MED ORDER — ACETAMINOPHEN 500 MG PO TABS: 500.0000 mg | ORAL_TABLET | Freq: Four times a day (QID) | ORAL | 0 refills | Status: AC | PRN

## 2023-06-23 MED ORDER — PREDNISONE 20 MG PO TABS: 40.0000 mg | ORAL_TABLET | Freq: Every day | ORAL | 0 refills | Status: AC

## 2023-06-23 NOTE — Discharge Instructions (Signed)
Your symptoms today are most likely being caused by a virus and should steadily improve in time it can take up to 7 to 10 days before you truly start to see a turnaround however things will get better  You have declined COVID testing, if you change your mind you may purchase a home test from any of the local pharmacies  On exam your lungs are clear and you are getting enough air therefore we have held off on completing chest x-ray  To help with sensation of shortness of breath begin prednisone every morning with food for 5 days to open and relax the airway  You may use Zofran every 8 hours to help calm nausea and vomiting, placed under tongue and allowed [place] dissolve, increase fluid intake until able to tolerate food as normal  For sensation of chills you may take Tylenol 1 to 2 tablets every 6 hours, use consistently,    For cough: honey 1/2 to 1 teaspoon (you can dilute the honey in water or another fluid).  You can also use guaifenesin and dextromethorphan for cough. You can use a humidifier for chest congestion and cough.  If you don't have a humidifier, you can sit in the bathroom with the hot shower running.      For sore throat: try warm salt water gargles, cepacol lozenges, throat spray, warm tea or water with lemon/honey, popsicles or ice, or OTC cold relief medicine for throat discomfort.   For congestion: take a daily anti-histamine like Zyrtec, Claritin, and a oral decongestant, such as pseudoephedrine.  You can also use Flonase 1-2 sprays in each nostril daily.   It is important to stay hydrated: drink plenty of fluids (water, gatorade/powerade/pedialyte, juices, or teas) to keep your throat moisturized and help further relieve irritation/discomfort.

## 2023-06-23 NOTE — ED Triage Notes (Signed)
Pt reports since Thursday she has had SHOB with activity,vomiting ,sore throat and chills.

## 2023-06-23 NOTE — ED Provider Notes (Signed)
[facility]    CSN: [id number] Arrival date & time: 06/23/23  1255      History   Chief Complaint Chief Complaint  Patient presents with   Emesis   Shortness of Breath   Chills    HPI Melissa Krueger is a 47 y.o. female.   Patient presents for evaluation of chills, nasal congestion, rhinorrhea, sore throat, nonproductive cough, intermittent shortness of breath and vomiting beginning 1 day ago.  Last bleed occurring this morning around 4 AM able to control with manual pressure.  Last occurrence of vomiting 1 day ago around 7:30 PM.  Decreased appetite but able to tolerate fluids.  Shortness of breath described as having to get more effort when taking a deep breath.  Denies respiratory history, daily tobacco use.  Has attempted use of over-the-counter day and night make sure medicine.  No known sick contacts.  Denies presence of fever.  Denies wheezing.   Past Medical History:  Diagnosis Date   Anemia    Daily headache    DVT (deep venous thrombosis) (HCC) ~ 2014   Heart murmur    "born w/one"   Hypertension    Menorrhagia    Obesity    Pulmonary embolism (HCC) ~ 2014   Stroke (HCC) 12/19/2017   "they called it a light stroke; left face; LUE, LLE numb/weaker" (12/19/2017)   Syncope     Patient Active Problem List   Diagnosis Date Noted   Situational depression 09/13/2020   Prediabetes 09/13/2020   Hyperlipidemia 09/10/2020   Cerebral embolism with cerebral infarction 12/20/2017   Vertebral artery dissection (HCC)    History of pulmonary embolism    Mild tetrahydrocannabinol (THC) abuse    Left-sided weakness 12/19/2017   Pulmonary embolism (HCC) 04/04/2015   Murmur 07/08/2014   Morbid obesity (HCC) 10/05/2011   Smoker 08/28/2009   Iron deficiency anemia 01/17/2007   HYPERTENSION, BENIGN SYSTEMIC 01/17/2007    Past Surgical History:  Procedure Laterality Date   REDUCTION MAMMAPLASTY  1996   TUBAL LIGATION  2007    OB History   No obstetric  history on file.      Home Medications    Prior to Admission medications   Medication Sig Start Date End Date Taking? Authorizing Provider  aspirin 325 MG EC tablet Take 1 tablet (325 mg total) by mouth daily. 09/10/20   Mullis, Kiersten P, DO  atorvastatin (LIPITOR) 80 MG tablet Take 1 tablet (80 mg total) by mouth daily at 6 PM. 09/10/20   Mullis, Kiersten P, DO  cyclobenzaprine (FLEXERIL) 5 MG tablet Take 1 tablet (5 mg total) by mouth at bedtime. 03/03/21   Valinda Hoar, NP  ferrous sulfate 325 (65 FE) MG tablet Take 1 tablet (325 mg total) by mouth daily with breakfast. 09/10/20   Mullis, Kiersten P, DO  ibuprofen (ADVIL) 800 MG tablet Take 1 tablet (800 mg total) by mouth 3 (three) times daily with meals. 05/23/23   Mardella Layman, MD  lisinopril-hydrochlorothiazide (PRINZIDE,ZESTORETIC) 20-25 MG per tablet Take 1 tablet by mouth daily. Patient not taking: No sig reported 04/16/15   Elenora Gamma, MD  oxyCODONE (ROXICODONE) 5 MG immediate release tablet Take 1 tablet (5 mg total) by mouth every 4 (four) hours as needed for up to 12 doses for severe pain. 06/30/20   Terald Sleeper, MD  sertraline (ZOLOFT) 50 MG tablet Take 1 tablet (50 mg total) by mouth daily. 09/10/20   Mullis, Kiersten P, DO  vitamin C (ASCORBIC ACID)  500 MG tablet Take 500 mg by mouth daily.    [provider]    Family History Family History  Problem Relation Age of Onset   Hypertension Mother    Hyperlipidemia Mother    Diabetes Father    Alcohol abuse Father    Lupus Sister     Social History Social History   Tobacco Use   Smoking status: Every Day    Current packs/day: 0.40    Average packs/day: 0.4 packs/day for 20.0 years (8.0 ttl pk-yrs)    Types: Cigarettes   Smokeless tobacco: Never  Vaping Use   Vaping status: Never Used  Substance Use Topics   Alcohol use: Yes    Comment: 12/19/2017 "might drink twice/month; couple drinks each time"   Drug use: Yes    Types: Marijuana     Comment: 12/19/2017 "daily"     Allergies   Patient has no known allergies.   Review of Systems Review of Systems  Constitutional:  Positive for chills. Negative for activity change, appetite change, diaphoresis, fatigue, fever and unexpected weight change.  HENT:  Positive for rhinorrhea and sore throat. Negative for congestion, dental problem, drooling, ear discharge, ear pain, facial swelling, hearing loss, mouth sores, nosebleeds, postnasal drip, sinus pressure, sinus pain, sneezing, tinnitus, trouble swallowing and voice change.   Respiratory:  Positive for cough and shortness of breath. Negative for apnea, choking, chest tightness, wheezing and stridor.   Cardiovascular: Negative.   Gastrointestinal:  Positive for vomiting. Negative for abdominal distention, abdominal pain, anal bleeding, blood in stool, constipation, diarrhea, nausea and rectal pain.  Skin: Negative.   Neurological: Negative.      Physical Exam Triage Vital Signs ED Triage Vitals  Encounter Vitals Group     BP 06/23/23 1408 (!) 170/109     Systolic BP Percentile --      Diastolic BP Percentile --      Pulse Rate 06/23/23 1408 79     Resp 06/23/23 1408 20     Temp 06/23/23 1408 99 F (37.2 C)     Temp src --      SpO2 06/23/23 1408 98 %     Weight --      Height --      Head Circumference --      Peak Flow --      Pain Score 06/23/23 1411 6     Pain Loc --      Pain Education --      Exclude from Growth Chart --    No data found.  Updated Vital Signs BP (!) 170/109   Pulse 79   Temp 99 F (37.2 C)   Resp 20   SpO2 98%   Visual Acuity Right Eye Distance:   Left Eye Distance:   Bilateral Distance:    Right Eye Near:   Left Eye Near:    Bilateral Near:     Physical Exam Constitutional:      Appearance: Normal appearance. She is well-developed.  HENT:     Head: Normocephalic.     Right Ear: Tympanic membrane, ear canal and external ear normal.     Left Ear: Tympanic membrane,  ear canal and external ear normal.     Nose: Congestion present.     Mouth/Throat:     Mouth: Mucous membranes are moist.     Pharynx: No posterior oropharyngeal erythema.  Eyes:     Extraocular Movements: Extraocular movements intact.  Cardiovascular:     Rate  and Rhythm: Normal rate and regular rhythm.     Pulses: Normal pulses.     Heart sounds: Normal heart sounds.  Pulmonary:     Effort: Pulmonary effort is normal.     Breath sounds: Normal breath sounds.  Skin:    General: Skin is warm and dry.  Neurological:     General: No focal deficit present.     Mental Status: She is alert and oriented to person, place, and time. Mental status is at baseline.      UC Treatments / Results  Labs (all labs ordered are listed, but only abnormal results are displayed) Labs Reviewed - No data to display  EKG   Radiology No results found.  Procedures Procedures (including critical care time)  Medications Ordered in UC Medications - No data to display  Initial Impression / Assessment and Plan / UC Course  I have reviewed the triage vital signs and the nursing notes.  Pertinent labs & imaging results that were available during my care of the patient were reviewed by me and considered in my medical decision making (see chart for details).  Viral illness  Patient is in no signs of distress nor toxic appearing.  Vital signs are stable.  Low suspicion for pneumonia, pneumothorax or bronchitis and therefore will defer imaging.  Declined COVID testing, discussed if changing mind may use home test kit.  Prescribed Tylenol, endorses that some point she was told not to use NSAIDs, prescribed prednisone for management of shortness of breath, at this time lungs are clear, prescribe Zofran for vomiting, advised increase fluid intake.May use additional over-the-counter medications as needed for supportive care.  May follow-up with urgent care as needed if symptoms persist or worsen.  Note  given.   Final Clinical Impressions(s) / UC Diagnoses   Final diagnoses:  None   Discharge Instructions   None    ED Prescriptions   None    PDMP not reviewed this encounter.   Valinda Hoar, [place] 06/24/23 [phone]
